# Patient Record
Sex: Female | Born: 1970 | Race: Black or African American | Hispanic: No | Smoking: Never smoker
Health system: Southern US, Community
[De-identification: ages and names within clinical notes are randomized; demographics above are authoritative.]

## PROBLEM LIST (undated history)

## (undated) DIAGNOSIS — K219 Gastro-esophageal reflux disease without esophagitis: Secondary | ICD-10-CM

## (undated) DIAGNOSIS — R011 Cardiac murmur, unspecified: Secondary | ICD-10-CM

## (undated) DIAGNOSIS — IMO0001 Reserved for inherently not codable concepts without codable children: Secondary | ICD-10-CM

## (undated) HISTORY — PX: MIDDLE EAR SURGERY: SHX713

## (undated) HISTORY — DX: Cardiac murmur, unspecified: R01.1

---

## 2007-03-20 ENCOUNTER — Emergency Department (HOSPITAL_COMMUNITY): Admission: EM | Admit: 2007-03-20 | Discharge: 2007-03-20 | Payer: Self-pay | Admitting: Emergency Medicine

## 2007-05-16 ENCOUNTER — Emergency Department (HOSPITAL_COMMUNITY): Admission: EM | Admit: 2007-05-16 | Discharge: 2007-05-16 | Payer: Self-pay | Admitting: Family Medicine

## 2007-05-29 ENCOUNTER — Emergency Department (HOSPITAL_COMMUNITY): Admission: EM | Admit: 2007-05-29 | Discharge: 2007-05-29 | Payer: Self-pay | Admitting: Emergency Medicine

## 2007-06-12 ENCOUNTER — Emergency Department (HOSPITAL_COMMUNITY): Admission: EM | Admit: 2007-06-12 | Discharge: 2007-06-12 | Payer: Self-pay | Admitting: Family Medicine

## 2007-06-27 ENCOUNTER — Emergency Department (HOSPITAL_COMMUNITY): Admission: EM | Admit: 2007-06-27 | Discharge: 2007-06-27 | Payer: Self-pay | Admitting: Emergency Medicine

## 2007-07-11 ENCOUNTER — Ambulatory Visit (HOSPITAL_BASED_OUTPATIENT_CLINIC_OR_DEPARTMENT_OTHER): Admission: RE | Admit: 2007-07-11 | Discharge: 2007-07-11 | Payer: Self-pay | Admitting: Otolaryngology

## 2007-10-02 ENCOUNTER — Emergency Department (HOSPITAL_COMMUNITY): Admission: EM | Admit: 2007-10-02 | Discharge: 2007-10-02 | Payer: Self-pay | Admitting: Family Medicine

## 2008-01-11 ENCOUNTER — Emergency Department (HOSPITAL_COMMUNITY): Admission: EM | Admit: 2008-01-11 | Discharge: 2008-01-11 | Payer: Self-pay | Admitting: Emergency Medicine

## 2008-01-25 ENCOUNTER — Ambulatory Visit: Payer: Self-pay | Admitting: Cardiology

## 2008-01-25 ENCOUNTER — Observation Stay (HOSPITAL_COMMUNITY): Admission: EM | Admit: 2008-01-25 | Discharge: 2008-01-25 | Payer: Self-pay | Admitting: Emergency Medicine

## 2008-04-02 ENCOUNTER — Inpatient Hospital Stay (HOSPITAL_COMMUNITY): Admission: RE | Admit: 2008-04-02 | Discharge: 2008-04-05 | Payer: Self-pay | Admitting: *Deleted

## 2008-04-02 ENCOUNTER — Encounter: Payer: Self-pay | Admitting: Emergency Medicine

## 2008-04-02 ENCOUNTER — Ambulatory Visit: Payer: Self-pay | Admitting: *Deleted

## 2008-07-02 ENCOUNTER — Emergency Department (HOSPITAL_COMMUNITY): Admission: EM | Admit: 2008-07-02 | Discharge: 2008-07-02 | Payer: Self-pay | Admitting: Emergency Medicine

## 2008-10-23 ENCOUNTER — Emergency Department (HOSPITAL_COMMUNITY): Admission: EM | Admit: 2008-10-23 | Discharge: 2008-10-24 | Payer: Self-pay | Admitting: Emergency Medicine

## 2008-11-24 ENCOUNTER — Emergency Department (HOSPITAL_COMMUNITY): Admission: EM | Admit: 2008-11-24 | Discharge: 2008-11-25 | Payer: Self-pay | Admitting: Emergency Medicine

## 2009-02-28 ENCOUNTER — Ambulatory Visit (HOSPITAL_BASED_OUTPATIENT_CLINIC_OR_DEPARTMENT_OTHER): Admission: RE | Admit: 2009-02-28 | Discharge: 2009-02-28 | Payer: Self-pay | Admitting: Otolaryngology

## 2009-12-30 ENCOUNTER — Emergency Department (HOSPITAL_COMMUNITY): Admission: EM | Admit: 2009-12-30 | Discharge: 2009-12-30 | Payer: Self-pay | Admitting: Emergency Medicine

## 2010-06-10 ENCOUNTER — Emergency Department (HOSPITAL_BASED_OUTPATIENT_CLINIC_OR_DEPARTMENT_OTHER)
Admission: EM | Admit: 2010-06-10 | Discharge: 2010-06-10 | Payer: Self-pay | Source: Home / Self Care | Admitting: Emergency Medicine

## 2010-06-15 LAB — URINALYSIS, ROUTINE W REFLEX MICROSCOPIC
Bilirubin Urine: NEGATIVE
Ketones, ur: NEGATIVE mg/dL
Leukocytes, UA: NEGATIVE
Protein, ur: NEGATIVE mg/dL
Urine Glucose, Fasting: NEGATIVE mg/dL
Urobilinogen, UA: 0.2 mg/dL (ref 0.0–1.0)
pH: 6 (ref 5.0–8.0)

## 2010-06-15 LAB — CBC
HCT: 38.5 % (ref 36.0–46.0)
Hemoglobin: 13 g/dL (ref 12.0–15.0)
MCH: 27.1 pg (ref 26.0–34.0)
RDW: 13.6 % (ref 11.5–15.5)

## 2010-06-15 LAB — POCT CARDIAC MARKERS
CKMB, poc: 4 ng/mL (ref 1.0–8.0)
Myoglobin, poc: 85.4 ng/mL (ref 12–200)

## 2010-06-15 LAB — URINE MICROSCOPIC-ADD ON

## 2010-06-15 LAB — COMPREHENSIVE METABOLIC PANEL
ALT: 20 U/L (ref 0–35)
AST: 39 U/L — ABNORMAL HIGH (ref 0–37)
Albumin: 4.5 g/dL (ref 3.5–5.2)
BUN: 15 mg/dL (ref 6–23)
Calcium: 9.7 mg/dL (ref 8.4–10.5)
Chloride: 107 mEq/L (ref 96–112)
Creatinine, Ser: 0.7 mg/dL (ref 0.4–1.2)
Sodium: 144 mEq/L (ref 135–145)
Total Protein: 8.1 g/dL (ref 6.0–8.3)

## 2010-06-15 LAB — DIFFERENTIAL
Eosinophils Relative: 1 % (ref 0–5)
Lymphocytes Relative: 33 % (ref 12–46)
Lymphs Abs: 2 10*3/uL (ref 0.7–4.0)

## 2010-06-15 LAB — D-DIMER, QUANTITATIVE: D-Dimer, Quant: 0.26 ug/mL-FEU (ref 0.00–0.48)

## 2010-08-24 ENCOUNTER — Other Ambulatory Visit: Payer: Self-pay | Admitting: Family Medicine

## 2010-08-24 ENCOUNTER — Ambulatory Visit
Admission: RE | Admit: 2010-08-24 | Discharge: 2010-08-24 | Disposition: A | Discharge status: Home / Self Care | Payer: PRIVATE HEALTH INSURANCE | Source: Ambulatory Visit | Attending: Family Medicine | Admitting: Family Medicine

## 2010-08-24 DIAGNOSIS — R0781 Pleurodynia: Secondary | ICD-10-CM

## 2010-08-31 LAB — COMPREHENSIVE METABOLIC PANEL
ALT: 41 U/L — ABNORMAL HIGH (ref 0–35)
AST: 46 U/L — ABNORMAL HIGH (ref 0–37)
CO2: 24 mEq/L (ref 19–32)
Chloride: 110 mEq/L (ref 96–112)
Creatinine, Ser: 0.88 mg/dL (ref 0.4–1.2)
GFR calc Af Amer: >60 mL/min (ref >60)
GFR calc non Af Amer: >60 mL/min (ref >60)
Glucose, Bld: 107 mg/dL — ABNORMAL HIGH (ref 70–99)
Total Bilirubin: 0.9 mg/dL (ref 0.3–1.2)

## 2010-08-31 LAB — CBC
HCT: 40.4 % (ref 36.0–46.0)
Hemoglobin: 13.4 g/dL (ref 12.0–15.0)
MCHC: 33.1 g/dL (ref 30.0–36.0)
MCV: 83.5 fL (ref 78.0–100.0)
Platelets: 204 10*3/uL (ref 150–400)
RBC: 4.83 MIL/uL (ref 3.87–5.11)
RDW: 13.9 % (ref 11.5–15.5)
WBC: 7.6 10*3/uL (ref 4.0–10.5)

## 2010-08-31 LAB — DIFFERENTIAL
Basophils Absolute: 0 10*3/uL (ref 0.0–0.1)
Basophils Relative: 0 % (ref 0–1)
Eosinophils Absolute: 0.3 10*3/uL (ref 0.0–0.7)
Eosinophils Relative: 5 % (ref 0–5)
Lymphocytes Relative: 31 % (ref 12–46)
Lymphs Abs: 2.3 10*3/uL (ref 0.7–4.0)
Monocytes Absolute: 0.5 10*3/uL (ref 0.1–1.0)
Monocytes Relative: 7 % (ref 3–12)
Neutro Abs: 4.4 10*3/uL (ref 1.7–7.7)
Neutrophils Relative %: 58 % (ref 43–77)

## 2010-08-31 LAB — URINE MICROSCOPIC-ADD ON

## 2010-08-31 LAB — PREGNANCY, URINE: Preg Test, Ur: NEGATIVE

## 2010-08-31 LAB — URINALYSIS, ROUTINE W REFLEX MICROSCOPIC
Bilirubin Urine: NEGATIVE
Hgb urine dipstick: NEGATIVE
Protein, ur: NEGATIVE mg/dL
Urobilinogen, UA: 0.2 mg/dL (ref 0.0–1.0)

## 2010-08-31 LAB — LIPASE, BLOOD: Lipase: 35 U/L (ref 11–59)

## 2010-09-08 LAB — DIFFERENTIAL
Basophils Absolute: 0.1 10*3/uL (ref 0.0–0.1)
Lymphocytes Relative: 28 % (ref 12–46)
Monocytes Absolute: 0.3 10*3/uL (ref 0.1–1.0)
Monocytes Relative: 5 % (ref 3–12)
Neutro Abs: 4.1 10*3/uL (ref 1.7–7.7)

## 2010-09-08 LAB — CBC
Hemoglobin: 14 g/dL (ref 12.0–15.0)
RBC: 4.96 MIL/uL (ref 3.87–5.11)
RDW: 13.7 % (ref 11.5–15.5)
WBC: 6.4 10*3/uL (ref 4.0–10.5)

## 2010-09-08 LAB — POCT I-STAT, CHEM 8
Calcium, Ion: 1.16 mmol/L (ref 1.12–1.32)
Glucose, Bld: 110 mg/dL — ABNORMAL HIGH (ref 70–99)
HCT: 44 % (ref 36.0–46.0)
Hemoglobin: 15 g/dL (ref 12.0–15.0)
Potassium: 3.9 mEq/L (ref 3.5–5.1)
TCO2: 24 mmol/L (ref 0–100)

## 2010-09-08 LAB — SAMPLE TO BLOOD BANK

## 2010-09-08 LAB — URINE MICROSCOPIC-ADD ON

## 2010-09-08 LAB — URINALYSIS, ROUTINE W REFLEX MICROSCOPIC
Bilirubin Urine: NEGATIVE
Nitrite: NEGATIVE
Specific Gravity, Urine: 1.014 (ref 1.005–1.030)
pH: 6.5 (ref 5.0–8.0)
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
## 2010-10-06 NOTE — H&P (Signed)
NAMEMALAN, WERK      ACCOUNT NO.:  [id number]   MEDICAL RECORD NO.:  [id number]          PATIENT TYPE:  INP   LOCATION:  3731                         FACILITY:  [facility]   PHYSICIAN:  Lonia Blood, M.D.       DATE OF BIRTH:  [birth year]   DATE OF ADMISSION:  01/24/2008  DATE OF DISCHARGE:  01/25/2008                              HISTORY & PHYSICAL   PRIMARY CARE PHYSICIAN:  This patient does not have a primary care  physician.   CHIEF COMPLAINT:  Chest pain.   HISTORY OF PRESENT ILLNESS:  [name] is a 40 year old refugee from  [place] of [place], [place] speaking, who comes into the  emergency room after about 3 months of episodes of on and off chest  pain.  The patient reports that the pain lasts for about 20-30 minutes.  She localizes the pain retrosternally radiating into the left upper  extremity.  She reports that the pain occasionally comes with work but  sometimes the pain also comes at rest.  She does not carry any  significant past medical history.  She does not smoke cigarettes.  She  does not know her family history.   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   HOME MEDICATIONS:  Various nonsteroidal anti-inflammatory drugs which  she has been taking after a fall that she sustained.   SOCIAL HISTORY:  The patient is a refugee, living in [place] with her  3 minor children, does not have much of a social network and support.   REVIEW OF SYSTEMS:  Negative for nausea, vomiting, or abdominal pain.  Negative diarrhea.  Negative headaches.  Otherwise as per HPI.   PHYSICAL EXAMINATION:  VITAL SIGNS:  Upon admission, temperature 97.1,  blood pressure 112/62, pulse 57, respirations 18, and saturation 100% on  room air.  GENERAL APPEARANCE:  She is a well-developed, well-nourished, African  American woman, in no acute distress, lying on a stretcher.  She is  alert and oriented to place, person, and time, calm and cooperative.  HEENT:  Head is normocephalic and atraumatic.  Eyes, pupils equal,  round, and reactive to light and accommodation.  Extraocular movements  are intact.  Throat clear.  NECK:  Supple.  No JVD.  CHEST:  Clear without wheeze, rhonchi, or crackles.  HEART:  Regular rate and rhythm without murmurs, rubs, or gallops.  ABDOMEN:  Soft, nontender, and nondistended.  Bowel sounds are present.  EXTREMITIES:  Lower extremities without edema.  SKIN:  Warm and dry without any suspicious-looking rashes.   LABORATORY VALUES:  At the time of admission, sodium 149, potassium 4.1,  white blood cell count 7.4, hemoglobin 13.1, and platelet count 209.  Troponin I is 0.01 and D-dimer 0.72.  Chest x-ray without any  infiltrates.  Chest CT negative for PE and infiltrates.  EKG shows some  negative T-waves in V1, hint of an ST elevation in I, II, III, F, and  VI.   ASSESSMENT AND PLAN:  Chest pain.  A 40 year old [place] American woman  without any significant cardiac history presenting today with what seems  to be possible angina.  She  has been worked up in the emergency room and  did not found to have a myocardial infarction and/or pulmonary emboli.  She is going to be placed on observation on the telemetry unit and we  are going to re-stratify her further with a Myoview stress test.  The  patient will receive symptomatic treatment for her pain with Tylenol and  ibuprofen.      Lonia Blood, M.D.  Electronically Signed     SL/MEDQ  D:  01/25/2008  T:  01/26/2008  Job:  [id number]

## 2010-10-06 NOTE — Discharge Summary (Signed)
NAMETATUMN, CORBRIDGE      ACCOUNT NO.:  [id number]   MEDICAL RECORD NO.:  [id number]          PATIENT TYPE:  INP   LOCATION:  3731                         FACILITY:  [facility]   PHYSICIAN:  Lonia Blood, M.D.       DATE OF BIRTH:  Mar 05, 1971   DATE OF ADMISSION:  01/24/2008  DATE OF DISCHARGE:  01/25/2008                               DISCHARGE SUMMARY   PRIMARY CARE PHYSICIAN:  This patient has been referred to [facility].   DISCHARGE DIAGNOSES:  1. Chest pain - felt to be musculoskeletal.  2. Mild obesity.   DISCHARGE MEDICATIONS:  1. Tylenol 500 mg every 6 hours as needed for pain.  2. Ibuprofen 200 mg 3 times a day.  3. Prilosec OTC 20 mg daily.   CONDITION ON DISCHARGE:  [name] was discharged in good condition  without any signs of distress.  She has got stable vital signs and she  is alert and oriented.  The patient was instructed to follow up with  [facility].   PROCEDURE:  The patient underwent a Myoview stress test which was  completely within normal limit.   CONSULTATION:  No consultations obtained.   History and physical, refer dictated H&P done by Dr. Harriette Ohara COURSE:  [name] is a 40 year old lady was placed in  observation from the emergency room.  She remained on telemetry without  any arrhythmias.  She did not have any recurrence of her chest pain.  The patient underwent a Myoview stress which is within normal limits and  since she had no further recurrence for symptoms she is discharged  currently in good condition and instructed to follow up with her new  primary care physician.      Lonia Blood, M.D.  Electronically Signed     SL/MEDQ  D:  01/25/2008  T:  01/26/2008  Job:  [id number]

## 2010-10-09 NOTE — Discharge Summary (Signed)
[name] Norris, [name] Norris      ACCOUNT NO.:  [id number]   MEDICAL RECORD NO.:  [id number]          PATIENT TYPE:  IPS   LOCATION:  0304                          [facility]  [facility]   PHYSICIAN:  Jasmine Pang, M.D. DATE OF BIRTH:  1971-05-20   DATE OF ADMISSION:  04/03/2008  DATE OF DISCHARGE:  04/05/2008                               DISCHARGE SUMMARY   IDENTIFICATION:  This is a 40 year old widowed African American female  who is a refugee from [place], who was admitted on voluntary admission on  April 03, 2008.   HISTORY OF PRESENT ILLNESS:  The patient states she has recently had a  history of depression with suicidal ideation.  She is a refugee from  [place].  Her husband was killed in the war there in 2007.  She does not  speak [place] and we had to use a translator to communicate.  She is  unemployed.  She is worried about having no job and may have no place to  live.  She had begun thoughts of suicide, but worried about who would  take care of her children.  She has been in the country less than 1  year.  She is sponsored by [employer], and they brought her to the  hospital after being concerned about her.   PAST PSYCHIATRIC HISTORY:  This is the first [facility] admission for the  patient.  She has had no previous psychiatric history.   FAMILY HISTORY:  None known.   ALCOHOL AND DRUG HISTORY:  She is a nonsmoker.  She does not use alcohol  or drugs.   MEDICAL PROBLEMS:  UTI, currently being treated with Macrobid.   MEDICATIONS:  Macrobid and rifampin and isoniazid (due to positive PPD  even though she does not have TB).   DRUG ALLERGIES:  No known drug allergies.   PHYSICAL FINDINGS:  The patient's physical exam was done in the ED prior  to admission here.  There were no acute physical or medical problems  noted.   ADMISSION LABORATORIES:  CBC was within normal limits.  UDS was  positive.  CMET was within normal limits.  Urine pregnancy test  negative.  Alcohol  level was less than 5.  Chest x-ray, no active  disease.  Urinalysis revealed a WBC of 3 to 6 and she is being currently  treated with Macrobid for what appears to be a UTI.   HOSPITAL COURSE:  Upon admission, the patient was started on Ambien 10  mg p.o. q.h.s. p.r.n.  She was also started on Macrobid 100 mg p.o.  b.i.d. for 7 days for her UTI and Celexa 10 mg p.o. daily, rifampin 600  mg p.o. daily, and isoniazid 300 mg p.o. daily.  The patient tolerated  these medications well with no side effects.  In individual sessions  with me, the patient was friendly and cooperative.  We had to speak  through a translator who spoke [place].  She did admit to some difficulty  falling asleep.  Her appetite was fair.  She stated she misses African  food.  Mood was becoming less depressed and less anxious.  She wanted to  go home to tomorrow.  She misses her children.  She has a 50 year old, 25-  year-old, and a 68-year-old.  She stated that she was less depressed.  She was not feeling like she wanted to harm herself.  She was willing to  go to [facility] for followup medication management.  She has a  support of a family through her church who was assisting her after her  discharge.  On April 05, 2008, sleep was good, appetite was good.  Mood was less depressed, less anxious.  Affect consistent with mood.  There was no suicidal or homicidal ideation.  No thoughts of self-  injurious behavior.  No auditory or visual hallucinations.  No paranoia  or delusions.  Thoughts were logical and goal-directed, thought content.  No predominant theme.  Cognitive was grossly intact.  Insight good,  judgment good, impulse control good.  It was felt the patient was ready  for discharge today.  She has a lot of community support.  The family  and her church who is helping her who is going to be taking her home.   DISCHARGE DIAGNOSES:  Axis I:  Major depressive disorder, single, severe  without psychosis.   Features of posttraumatic stress disorder (from the  war in [place]).  Axis II:  None.  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  Axis IV:  Severe (occupational problem, housing problem, other  psychosocial problem, burden of psychiatric illness)  Axis V:  Global assessment of functioning was 50 upon discharge.  GAF  was 35 upon admission.  GAF highest past year was 65-70.   DISCHARGE PLANS:  There was no specific activity level or dietary  restrictions.   POSTHOSPITAL CARE PLANS:  The patient will go to the [facility] on  April 05, 2008, at 2 o'clock p.m.   DISCHARGE MEDICATIONS:  1. Celexa 20 mg daily.  Resume INH and rifampin as her doctor      suggested.  Contact to doctor for other medications.  2. Macrobid 100 mg twice a day for five more days.  She is to see her      doctor if urinary tract infection is no better or if she develops      fever, chills, pain, etc.      Jasmine Pang, M.D.  Electronically Signed     BHS/MEDQ  D:  04/06/2008  T:  04/07/2008  Job:  [id number]

## 2010-12-10 ENCOUNTER — Emergency Department (HOSPITAL_COMMUNITY)
Admission: EM | Admit: 2010-12-10 | Discharge: 2010-12-10 | Disposition: A | Discharge status: Home / Self Care | Payer: PRIVATE HEALTH INSURANCE | Attending: Emergency Medicine | Admitting: Emergency Medicine

## 2010-12-10 ENCOUNTER — Emergency Department (HOSPITAL_COMMUNITY): Payer: PRIVATE HEALTH INSURANCE

## 2010-12-10 DIAGNOSIS — R079 Chest pain, unspecified: Secondary | ICD-10-CM | POA: Insufficient documentation

## 2010-12-10 DIAGNOSIS — K219 Gastro-esophageal reflux disease without esophagitis: Secondary | ICD-10-CM | POA: Insufficient documentation

## 2010-12-10 DIAGNOSIS — Z79899 Other long term (current) drug therapy: Secondary | ICD-10-CM | POA: Insufficient documentation

## 2010-12-10 DIAGNOSIS — R1013 Epigastric pain: Secondary | ICD-10-CM | POA: Insufficient documentation

## 2010-12-10 LAB — CK TOTAL AND CKMB (NOT AT ARMC)
CK, MB: 2.7 ng/mL (ref 0.3–4.0)
Relative Index: 1.3 (ref 0.0–2.5)
Total CK: 210 U/L — ABNORMAL HIGH (ref 7–177)

## 2010-12-10 LAB — DIFFERENTIAL
Basophils Absolute: 0 10*3/uL (ref 0.0–0.1)
Basophils Relative: 1 % (ref 0–1)
Eosinophils Absolute: 0.1 10*3/uL (ref 0.0–0.7)
Eosinophils Relative: 2 % (ref 0–5)
Lymphocytes Relative: 44 % (ref 12–46)

## 2010-12-10 LAB — BASIC METABOLIC PANEL
BUN: 10 mg/dL (ref 6–23)
Creatinine, Ser: 0.61 mg/dL (ref 0.50–1.10)
GFR calc Af Amer: >60 mL/min (ref >60)
GFR calc non Af Amer: >60 mL/min (ref >60)
Glucose, Bld: 117 mg/dL — ABNORMAL HIGH (ref 70–99)

## 2010-12-10 LAB — CBC
Platelets: 183 10*3/uL (ref 150–400)
RDW: 13.1 % (ref 11.5–15.5)
WBC: 6.5 10*3/uL (ref 4.0–10.5)

## 2010-12-10 LAB — LIPASE, BLOOD: Lipase: 51 U/L (ref 11–59)

## 2011-02-12 LAB — POCT HEMOGLOBIN-HEMACUE: Hemoglobin: 14.2

## 2011-02-23 LAB — COMPREHENSIVE METABOLIC PANEL
Albumin: 4.2
BUN: 17
Chloride: 105
Creatinine, Ser: 0.81
GFR calc non Af Amer: >60
Glucose, Bld: 98
Total Bilirubin: 0.7

## 2011-02-23 LAB — URINE MICROSCOPIC-ADD ON

## 2011-02-23 LAB — DIFFERENTIAL
Basophils Absolute: 0
Lymphocytes Relative: 33
Neutro Abs: 3.6
Neutrophils Relative %: 51

## 2011-02-23 LAB — URINE CULTURE: Colony Count: 40000

## 2011-02-23 LAB — URINALYSIS, ROUTINE W REFLEX MICROSCOPIC
Glucose, UA: NEGATIVE
Protein, ur: NEGATIVE
pH: 6

## 2011-02-23 LAB — CBC
HCT: 37.9
MCV: 82.7
Platelets: 216
WBC: 6.9

## 2011-02-24 LAB — DIFFERENTIAL
Basophils Absolute: 0
Basophils Relative: 0
Eosinophils Absolute: 0.4
Neutro Abs: 4.4
Neutrophils Relative %: 60

## 2011-02-24 LAB — POCT I-STAT, CHEM 8
Glucose, Bld: 94
HCT: 40
Hemoglobin: 13.6
Potassium: 4.1
Sodium: 141
TCO2: 25

## 2011-02-24 LAB — LIPID PANEL
Cholesterol: 158
HDL: 43

## 2011-02-24 LAB — POCT CARDIAC MARKERS
CKMB, poc: <1 — ABNORMAL LOW
CKMB, poc: <1 — ABNORMAL LOW
Troponin i, poc: 0.47 — ABNORMAL HIGH
Troponin i, poc: <0.05

## 2011-02-24 LAB — CK TOTAL AND CKMB (NOT AT ARMC)
CK, MB: 0.6
Relative Index: 1
Relative Index: INVALID

## 2011-02-24 LAB — CBC
MCHC: 33.9
MCV: 82.6
RDW: 13.6

## 2011-02-24 LAB — TROPONIN I
Troponin I: 0.01
Troponin I: <0.01

## 2011-02-26 LAB — POCT PREGNANCY, URINE
Operator id: 235561
Preg Test, Ur: NEGATIVE

## 2011-04-11 ENCOUNTER — Emergency Department (INDEPENDENT_AMBULATORY_CARE_PROVIDER_SITE_OTHER)
Admission: EM | Admit: 2011-04-11 | Discharge: 2011-04-11 | Disposition: A | Discharge status: Home / Self Care | Payer: PRIVATE HEALTH INSURANCE | Source: Home / Self Care | Attending: Emergency Medicine | Admitting: Emergency Medicine

## 2011-04-11 ENCOUNTER — Encounter: Payer: Self-pay | Admitting: *Deleted

## 2011-04-11 ENCOUNTER — Other Ambulatory Visit: Payer: Self-pay

## 2011-04-11 ENCOUNTER — Emergency Department (INDEPENDENT_AMBULATORY_CARE_PROVIDER_SITE_OTHER): Payer: PRIVATE HEALTH INSURANCE

## 2011-04-11 DIAGNOSIS — K219 Gastro-esophageal reflux disease without esophagitis: Secondary | ICD-10-CM

## 2011-04-11 HISTORY — DX: Gastro-esophageal reflux disease without esophagitis: K21.9

## 2011-04-11 MED ORDER — TRAMADOL HCL 50 MG PO TABS: 50.0000 mg | ORAL_TABLET | Freq: Four times a day (QID) | ORAL | Status: DC | PRN

## 2011-04-11 MED ORDER — SUCRALFATE 1 GM/10ML PO SUSP: 1.0000 g | Freq: Four times a day (QID) | ORAL | Status: DC

## 2011-04-11 MED ORDER — ESOMEPRAZOLE MAGNESIUM 40 MG PO CPDR: 40.0000 mg | DELAYED_RELEASE_CAPSULE | Freq: Two times a day (BID) | ORAL | Status: DC

## 2011-04-11 MED ORDER — GI COCKTAIL ~~LOC~~
30.0000 mL | Freq: Once | ORAL | Status: AC
  Administered 2011-04-11: 30 mL via ORAL

## 2011-04-11 MED ORDER — GI COCKTAIL ~~LOC~~
ORAL | Status: AC
  Filled 2011-04-11: qty 30

## 2011-04-11 NOTE — ED Provider Notes (Signed)
History     CSN: [id number] Arrival date & time: 04/11/2011  3:16 PM   First MD Initiated Contact with Patient 04/11/11 1439      Chief Complaint  Patient presents with  . Chest Pain    (Consider location/radiation/quality/duration/timing/severity/associated sxs/prior treatment) HPI Comments: [name] Norris has had a one-week history of continuous pain in her left submammary area radiating around to the back. The pain is constant and severe. She feels mildly short of breath off and on and has had a few episodes of diaphoresis. The pain is worse at work, when she lies down, or after certain foods. It's also worse with a deep breath. There is nothing particular that relieves it. It feels like a burning or a sharp feeling. She's felt nauseated and vomited once and she has had some dysphagia and a dry cough. She saw her primary care physician for this last week and was diagnosed with reflux and given omeprazole. She states this has not helped at all. She has had similar pain going on for at least 4 months. She was in the emergency room with this pain back in July and had a negative cardiac workup at that time.  Patient is a 40 y.o. female presenting with chest pain.  Chest Pain Primary symptoms include shortness of breath, nausea and vomiting. Pertinent negatives for primary symptoms include no fever, no cough, no wheezing, no palpitations and no abdominal pain.  Associated symptoms include diaphoresis.     Past Medical History  Diagnosis Date  . Acid reflux     Past Surgical History  Procedure Date  . Cesarean section     History reviewed. No pertinent family history.  History  Substance Use Topics  . Smoking status: Not on file  . Smokeless tobacco: Not on file  . Alcohol Use: Yes    OB History    Grav Para Term Preterm Abortions TAB SAB Ect Mult Living                  Review of Systems  Constitutional: Positive for diaphoresis. Negative for fever and chills.  Respiratory:  Positive for shortness of breath. Negative for cough and wheezing.   Cardiovascular: Positive for chest pain. Negative for palpitations and leg swelling.  Gastrointestinal: Positive for nausea and vomiting. Negative for abdominal pain.    Allergies  Review of patient's allergies indicates no known allergies.  Home Medications   Current Outpatient Rx  Name Route Sig Dispense Refill  . DEXTROMETHORPHAN-GUAIFENESIN 30-600 MG PO TB12 Oral Take 1 tablet by mouth every 12 (twelve) hours.      Marland Kitchen ESOMEPRAZOLE MAGNESIUM 40 MG PO CPDR Oral Take 1 capsule (40 mg total) by mouth 2 (two) times daily at 10 AM and 5 PM. 30 capsule 0  . SUCRALFATE 1 GM/10ML PO SUSP Oral Take 10 mLs (1 g total) by mouth 4 (four) times daily. 420 mL 0  . TRAMADOL HCL 50 MG PO TABS Oral Take 1 tablet (50 mg total) by mouth every 6 (six) hours as needed for pain. Maximum dose= 8 tablets per day 30 tablet 0    BP 136/86  Pulse 69  Temp(Src) 98.5 F (36.9 C) (Oral)  Resp 19  SpO2 98%  Physical Exam  Nursing note and vitals reviewed. Constitutional: She appears well-developed and well-nourished. She appears distressed.       She appears uncomfortable.  Cardiovascular: Normal rate, regular rhythm, S1 normal, S2 normal, intact distal pulses and normal pulses.   No extrasystoles  are present. PMI is not displaced.  Exam reveals no gallop, no S3, no S4, no distant heart sounds and no friction rub.   No murmur heard. Pulmonary/Chest: Effort normal and breath sounds normal. No respiratory distress. She has no wheezes. She has no rales. She exhibits no tenderness.  Abdominal: Soft. Bowel sounds are normal. She exhibits no distension and no mass. There is no tenderness. There is no rebound and no guarding.  Skin: Skin is warm and dry. No rash noted. She is not diaphoretic.    ED Course  Procedures (including critical care)   Date: 04/11/2011  Rate: 62  Rhythm: normal sinus rhythm  QRS Axis: normal  Intervals: normal   ST/T Wave abnormalities: normal  Conduction Disutrbances:none  Narrative Interpretation: Normal EKG.  Old EKG Reviewed: none available   Results for orders placed during the hospital encounter of 04/11/11  POCT H PYLORI SCREEN      Component Value Range   H. PYLORI SCREEN, POC NEGATIVE  NEGATIVE   D-DIMER, QUANTITATIVE      Component Value Range   D-Dimer, Quant 0.51 (*) 0.00 - 0.48 (ug/mL-FEU)     Labs Reviewed  D-DIMER, QUANTITATIVE - Abnormal; Notable for the following:    D-Dimer, Quant 0.51 (*)    All other components within normal limits  POCT H PYLORI SCREEN   Dg Chest 2 View  04/11/2011  *RADIOLOGY REPORT*  Clinical Data: Chest pain  CHEST - 2 VIEW  Comparison: 12/10/2010  Findings: Cardiomediastinal silhouette is within normal limits. The lungs are clear. No pleural effusion.  No pneumothorax.  No acute osseous abnormality.  IMPRESSION: Normal chest.  Original Report Authenticated By: Harrel Lemon, M.D.    The patient was given the following meds in the [facility]:   Medications  omeprazole (PRILOSEC) 20 MG capsule (not administered)  dextromethorphan-guaiFENesin (MUCINEX DM) 30-600 MG per 12 hr tablet (not administered)  naproxen sodium (ANAPROX) 220 MG tablet (not administered)  esomeprazole (NEXIUM) 40 MG capsule (not administered)  sucralfate (CARAFATE) 1 GM/10ML suspension (not administered)  traMADol (ULTRAM) 50 MG tablet (not administered)  gi cocktail (30 mL Oral Given 04/11/11 1604)     And had the following response:  Her pain improved after one dose of GI cocktail.   1. GERD (gastroesophageal reflux disease)       MDM  I think she has reflux esophagitis. Her d-dimer was only minimally elevated and. She did improve extra dose of GI cocktail. I have sent her home on Nexium and Carafate and told her to leave off the knee that she was taking. She was given tramadol for the pain and is to followup with [facility] as soon as possible.        Roque Lias, MD 04/11/11 [phone]

## 2011-04-11 NOTE — ED Notes (Signed)
Co chest pain underneath left breast, some underneath right breast, nonradiating, states she does have some diaphoresis and sob with episodes of pain.

## 2011-04-12 ENCOUNTER — Encounter: Payer: Self-pay | Admitting: Gastroenterology

## 2011-04-13 ENCOUNTER — Emergency Department (HOSPITAL_COMMUNITY): Payer: PRIVATE HEALTH INSURANCE

## 2011-04-13 ENCOUNTER — Emergency Department (HOSPITAL_COMMUNITY)
Admission: EM | Admit: 2011-04-13 | Discharge: 2011-04-13 | Disposition: A | Discharge status: Home / Self Care | Payer: PRIVATE HEALTH INSURANCE | Attending: Emergency Medicine | Admitting: Emergency Medicine

## 2011-04-13 ENCOUNTER — Encounter (HOSPITAL_COMMUNITY): Payer: Self-pay | Admitting: Emergency Medicine

## 2011-04-13 ENCOUNTER — Other Ambulatory Visit: Payer: Self-pay

## 2011-04-13 DIAGNOSIS — R059 Cough, unspecified: Secondary | ICD-10-CM | POA: Insufficient documentation

## 2011-04-13 DIAGNOSIS — K219 Gastro-esophageal reflux disease without esophagitis: Secondary | ICD-10-CM | POA: Insufficient documentation

## 2011-04-13 DIAGNOSIS — R079 Chest pain, unspecified: Secondary | ICD-10-CM | POA: Insufficient documentation

## 2011-04-13 DIAGNOSIS — R05 Cough: Secondary | ICD-10-CM | POA: Insufficient documentation

## 2011-04-13 LAB — POCT I-STAT TROPONIN I: Troponin i, poc: 0 ng/mL (ref 0.00–0.08)

## 2011-04-13 MED ORDER — FENTANYL CITRATE 0.05 MG/ML IJ SOLN
INTRAMUSCULAR | Status: AC
  Filled 2011-04-13: qty 2

## 2011-04-13 MED ORDER — IOHEXOL 300 MG/ML  SOLN
100.0000 mL | Freq: Once | INTRAMUSCULAR | Status: AC | PRN
  Administered 2011-04-13: 100 mL via INTRAVENOUS

## 2011-04-13 MED ORDER — ONDANSETRON HCL 4 MG/2ML IJ SOLN
INTRAMUSCULAR | Status: AC
  Filled 2011-04-13: qty 2

## 2011-04-13 MED ORDER — GI COCKTAIL ~~LOC~~
30.0000 mL | Freq: Once | ORAL | Status: AC
  Administered 2011-04-13: 30 mL via ORAL
  Filled 2011-04-13: qty 30

## 2011-04-13 NOTE — ED Notes (Signed)
Pt given discharge instructions via language line. Pt unable to esign d/c instruction be/c e sign not working at time of d/c

## 2011-04-13 NOTE — ED Provider Notes (Signed)
History     CSN: [id number] Arrival date & time: 04/13/2011  2:04 PM   First MD Initiated Contact with Patient 04/13/11 1407      Chief Complaint  Patient presents with  . Chest Pain    (Consider location/radiation/quality/duration/timing/severity/associated sxs/prior treatment) Patient is a 40 y.o. female presenting with chest pain. The history is provided by the patient. The history is limited by a language barrier. A language interpreter was used.  Chest Pain The chest pain began 1 - 2 weeks ago. Chest pain occurs intermittently. The chest pain is unchanged. The pain is associated with breathing. At its most intense, the pain is at 8/10. The pain is currently at 8/10. The quality of the pain is described as sharp. Primary symptoms include cough. Pertinent negatives for primary symptoms include no fever, no shortness of breath, no wheezing, no palpitations, no abdominal pain, no nausea, no vomiting and no dizziness. Primary symptoms comment: cough for 3 days She tried nitroglycerin, antacids and aspirin (not much improvement) for the symptoms.   Pt speaks only french and using the interpretor phones she was unable to answer most of my questions however stated that breathing makes it worse and it's worse today than it has been. She has been dealing with this for 2 weeks off and on. Nitro given by EMS did not alleviate the pain.   Past Medical History  Diagnosis Date  . Acid reflux     Past Surgical History  Procedure Date  . Cesarean section     History reviewed. No pertinent family history.  History  Substance Use Topics  . Smoking status: Not on file  . Smokeless tobacco: Not on file  . Alcohol Use: Yes    OB History    Grav Para Term Preterm Abortions TAB SAB Ect Mult Living                  Review of Systems  Constitutional: Negative for fever, chills, activity change and appetite change.  Respiratory: Positive for cough. Negative for apnea, choking, chest  tightness, shortness of breath and wheezing.   Cardiovascular: Positive for chest pain. Negative for palpitations and leg swelling.  Gastrointestinal: Negative for nausea, vomiting, abdominal pain, diarrhea, constipation, blood in stool and anal bleeding.  Neurological: Negative for dizziness.  All other systems reviewed and are negative.    Allergies  Review of patient's allergies indicates no known allergies.  Home Medications   Current Outpatient Rx  Name Route Sig Dispense Refill  . DEXTROMETHORPHAN-GUAIFENESIN 30-600 MG PO TB12 Oral Take 1 tablet by mouth every 12 (twelve) hours.      Marland Kitchen ESOMEPRAZOLE MAGNESIUM 40 MG PO CPDR Oral Take 1 capsule (40 mg total) by mouth 2 (two) times daily at 10 AM and 5 PM. 30 capsule 0  . SUCRALFATE 1 GM/10ML PO SUSP Oral Take 10 mLs (1 g total) by mouth 4 (four) times daily. 420 mL 0  . TRAMADOL HCL 50 MG PO TABS Oral Take 1 tablet (50 mg total) by mouth every 6 (six) hours as needed for pain. Maximum dose= 8 tablets per day 30 tablet 0    BP 137/68  Pulse 87  Resp 24  SpO2 97%  Physical Exam  Constitutional: She is oriented to person, place, and time. She appears well-developed and well-nourished.  HENT:  Head: Normocephalic and atraumatic.  Eyes: EOM are normal. Pupils are equal, round, and reactive to light.  Neck: Normal range of motion. Neck supple.  Cardiovascular: Normal  rate and regular rhythm.   Pulmonary/Chest:       Pt uncooperative and crying during exam. Would not take deep breath, no obvious rales or crackles heard on exam.  Abdominal: Soft. She exhibits no distension. There is no tenderness. There is no rebound.       Voluntary guarding from pain crying and moaning. Bowel sounds heard.  Neurological: She is alert and oriented to person, place, and time. No cranial nerve deficit.  Skin: Skin is warm and dry.    ED Course  Procedures (including critical care time)   Labs Reviewed  I-STAT TROPONIN I   Dg Chest 2  View  04/11/2011  *RADIOLOGY REPORT*  Clinical Data: Chest pain  CHEST - 2 VIEW  Comparison: 12/10/2010  Findings: Cardiomediastinal silhouette is within normal limits. The lungs are clear. No pleural effusion.  No pneumothorax.  No acute osseous abnormality.  IMPRESSION: Normal chest.  Original Report Authenticated By: Harrel Lemon, M.D.     1. Acid reflux       MDM  Since pt had elevated d-dimer two days ago and comes in with repeat chest pain and lack of good historian to describe pain will get ct angio to rule out PE, check troponin to rule out cardiac etiology. Not likely to be cardiac in nature as pt had negative work up in July of this year. Last work up for this pain 2 days ago. She states that nothing has helped but cannot provide much history. Nitroglycerin did not help the pain from EMS and pt was given aspirin by EMS. Will try GI cocktail to see if that alleviates the pain. Unclear what medications pt has actually been taking.      3:22 PM Pt seen in the room and resting comfortably. If all rule outs are negative can discharge with GI recommendation for follow up as GI cocktail seems to help the pain. Will sign out to doctor on call. Troponin and CTangio chest still pending. Discussed with Dr. Radford Pax.  Genella Mech, MD 04/13/11 [phone]

## 2011-04-13 NOTE — ED Notes (Signed)
Per EMS, patient was picked up at [facility].  Patient was found moaning and crying in car; passerby called EMS.  Patient speaks little [place] (main language [place]); was just seen in hospital and was released home this past Sunday.  Upon EMS arrival, patient was found crying, restless, and moaning in her vehicle.  Patient hot and sweaty.  Points across chest (underneath breast) and stated, "This hurts".  Pharmacist at [facility] came out and gave patient 324 aspirin; 1 nitro given by EMS (patient states that it did not help with pain).  12 lead done; unremarkable.  No IV started.  CBG -- 97.  Vitals -- 120/90 BP; 80 HR.

## 2011-04-13 NOTE — ED Provider Notes (Signed)
History/physical exam/procedure(s) were performed by non-physician practitioner and as supervising physician I was immediately available for consultation/collaboration. I have reviewed all notes and am in agreement with care and plan.   Hilario Quarry, MD 04/13/11 1540

## 2011-04-13 NOTE — ED Notes (Signed)
Patient brought in by EMS; patient speaks [place] -- using interpreter phones at bedside to translate.  Patient complaining of chest pain; has been in and out of the hospital for the past two weeks for the same symptoms.  Describes location of chest pain as "left" and "right" sided; pain worsens upon breathing.  Pain started today while she was driving.  Today, patient was found crying and moaning in her car by a passerby -- EMS was called to assist patient.  Patient was given 324 mg aspirin by pharmacist at [facility] and 1 nitro SL tablet by EMS.  Patient denies having recent fevers.  Denies cardiac history.  Upon arrival to room, patient connected to continuous cardiac, pulse ox, and blood pressure monitor.  ECG done.  Patient tearful, restless, and moaning.  EDP at bedside.  Will continue to monitor.

## 2011-04-16 ENCOUNTER — Emergency Department (HOSPITAL_COMMUNITY): Payer: PRIVATE HEALTH INSURANCE

## 2011-04-16 ENCOUNTER — Encounter (HOSPITAL_COMMUNITY): Payer: Self-pay | Admitting: Emergency Medicine

## 2011-04-16 ENCOUNTER — Emergency Department (HOSPITAL_COMMUNITY)
Admission: EM | Admit: 2011-04-16 | Discharge: 2011-04-17 | Disposition: A | Discharge status: Home / Self Care | Payer: PRIVATE HEALTH INSURANCE | Attending: Emergency Medicine | Admitting: Emergency Medicine

## 2011-04-16 DIAGNOSIS — Z79899 Other long term (current) drug therapy: Secondary | ICD-10-CM | POA: Insufficient documentation

## 2011-04-16 DIAGNOSIS — K219 Gastro-esophageal reflux disease without esophagitis: Secondary | ICD-10-CM | POA: Insufficient documentation

## 2011-04-16 LAB — CBC
Hemoglobin: 12.8 g/dL (ref 12.0–15.0)
MCH: 27.4 pg (ref 26.0–34.0)
Platelets: 194 10*3/uL (ref 150–400)
RBC: 4.68 MIL/uL (ref 3.87–5.11)

## 2011-04-16 LAB — POCT I-STAT TROPONIN I: Troponin i, poc: 0 ng/mL (ref 0.00–0.08)

## 2011-04-16 NOTE — ED Notes (Signed)
PT. REPORTS INTERMITTENT SUBSTERNAL CHEST PAIN X 3 WEEKS WORSE WITH DEEP INSPIRATION AND OCCASIONAL DRY COUGH , SLIGHT NAUSEA, NO DIAPHORESIS.

## 2011-04-17 LAB — BASIC METABOLIC PANEL
CO2: 26 mEq/L (ref 19–32)
Calcium: 10 mg/dL (ref 8.4–10.5)
GFR calc non Af Amer: >90 mL/min (ref >90)
Potassium: 3.8 mEq/L (ref 3.5–5.1)
Sodium: 136 mEq/L (ref 135–145)

## 2011-04-17 MED ORDER — SODIUM CHLORIDE 0.9 % IV BOLUS (SEPSIS)
1000.0000 mL | Freq: Once | INTRAVENOUS | Status: AC
  Administered 2011-04-17: 1000 mL via INTRAVENOUS

## 2011-04-17 MED ORDER — FAMOTIDINE IN NACL 20-0.9 MG/50ML-% IV SOLN
20.0000 mg | Freq: Once | INTRAVENOUS | Status: AC
  Administered 2011-04-17: 20 mg via INTRAVENOUS
  Filled 2011-04-17: qty 50

## 2011-04-17 MED ORDER — GI COCKTAIL ~~LOC~~
30.0000 mL | Freq: Once | ORAL | Status: AC
  Administered 2011-04-17: 30 mL via ORAL
  Filled 2011-04-17: qty 30

## 2011-04-17 MED ORDER — FAMOTIDINE 20 MG PO TABS: 20.0000 mg | ORAL_TABLET | Freq: Two times a day (BID) | ORAL | Status: DC

## 2011-04-17 MED ORDER — PANTOPRAZOLE SODIUM 40 MG IV SOLR
40.0000 mg | Freq: Once | INTRAVENOUS | Status: AC
  Administered 2011-04-17: 40 mg via INTRAVENOUS
  Filled 2011-04-17: qty 40

## 2011-04-17 MED ORDER — ONDANSETRON HCL 4 MG/2ML IJ SOLN
4.0000 mg | Freq: Once | INTRAMUSCULAR | Status: AC
  Administered 2011-04-17: 4 mg via INTRAVENOUS
  Filled 2011-04-17: qty 2

## 2011-04-17 MED ORDER — HYDROMORPHONE HCL PF 1 MG/ML IJ SOLN
1.0000 mg | Freq: Once | INTRAMUSCULAR | Status: AC
  Administered 2011-04-17: 1 mg via INTRAVENOUS
  Filled 2011-04-17: qty 1

## 2011-04-17 NOTE — ED Provider Notes (Signed)
History     CSN: [id number] Arrival date & time: 04/16/2011 10:34 PM   First MD Initiated Contact with Patient 04/17/11 0133      Chief Complaint  Patient presents with  . Chest Pain    (Consider location/radiation/quality/duration/timing/severity/associated sxs/prior treatment) Patient is a 40 y.o. female presenting with chest pain. The history is provided by the patient.  Chest Pain The chest pain began 2 days ago. Duration of episode(s) is 48 hours. Chest pain occurs constantly. The chest pain is unchanged. Associated with: Food makes it worse. The severity of the pain is severe. The quality of the pain is described as burning. The pain radiates to the epigastrium. Chest pain is worsened by eating. Primary symptoms include nausea and vomiting. Pertinent negatives for primary symptoms include no fever, no syncope, no cough, no wheezing, no palpitations and no altered mental status.  Pertinent negatives for associated symptoms include no diaphoresis. She tried proton pump inhibitors for the symptoms. Risk factors include no known risk factors.  Pertinent negatives for past medical history include no CAD.    patient's multiple ED visits for reflux symptoms is on Carafate and proton pump inhibitor with persistent ongoing symptoms. She is scheduled to see gastroenterology on December 4. She denies any blood in emesis or black or tarry stools. She has a history of peptic ulcer disease. Tonight she presents because she is unable to sleep your symptoms. She states that anything she eats makes it worse. No fevers. No right upper quadrant abdominal pain or history of gallbladder disease.  Past Medical History  Diagnosis Date  . Acid reflux     Past Surgical History  Procedure Date  . Cesarean section     No family history on file.  History  Substance Use Topics  . Smoking status: Not on file  . Smokeless tobacco: Not on file  . Alcohol Use: Yes    OB History    Grav Para Term  Preterm Abortions TAB SAB Ect Mult Living                  Review of Systems  Constitutional: Negative for fever and diaphoresis.  Respiratory: Negative for cough, wheezing and stridor.   Cardiovascular: Positive for chest pain. Negative for palpitations and syncope.  Gastrointestinal: Positive for nausea and vomiting. Negative for diarrhea, constipation and blood in stool.  Genitourinary: Negative for hematuria and pelvic pain.  Musculoskeletal: Negative for back pain.  Skin: Negative for color change and wound.  Neurological: Negative for syncope.  Psychiatric/Behavioral: Negative for altered mental status.    Allergies  Review of patient's allergies indicates no known allergies.  Home Medications   Current Outpatient Rx  Name Route Sig Dispense Refill  . ESOMEPRAZOLE MAGNESIUM 40 MG PO CPDR Oral Take 1 capsule (40 mg total) by mouth 2 (two) times daily at 10 AM and 5 PM. 30 capsule 0  . SUCRALFATE 1 GM/10ML PO SUSP Oral Take 10 mLs (1 g total) by mouth 4 (four) times daily. 420 mL 0  . TRAMADOL HCL 50 MG PO TABS Oral Take 50 mg by mouth every 6 (six) hours as needed. For pain       BP 117/71  Pulse 56  Temp(Src) 97.7 F (36.5 C) (Oral)  Resp 14  SpO2 97%  LMP 03/13/2011  Physical Exam  Constitutional: She is oriented to person, place, and time. She appears well-developed and well-nourished.  HENT:  Head: Normocephalic and atraumatic.  Eyes: Conjunctivae and EOM are normal.  Pupils are equal, round, and reactive to light.  Neck: Trachea normal. Neck supple. No thyromegaly present.  Cardiovascular: Normal rate, regular rhythm, S1 normal, S2 normal and normal pulses.     No systolic murmur is present   No diastolic murmur is present  Pulses:      Radial pulses are 2+ on the right side, and 2+ on the left side.  Pulmonary/Chest: Effort normal and breath sounds normal. She has no wheezes. She has no rhonchi. She has no rales. She exhibits no tenderness.  Abdominal:  Soft. Normal appearance and bowel sounds are normal. There is no CVA tenderness and negative Murphy's sign.       Mild epigastric tenderness. Negative Murphy's sign and no right upper quadrant tenderness. No left upper quadrant tenderness  Musculoskeletal:       BLE:s Calves nontender, no cords or erythema, negative Homans sign  Neurological: She is alert and oriented to person, place, and time. She has normal strength. No cranial nerve deficit or sensory deficit. GCS eye subscore is 4. GCS verbal subscore is 5. GCS motor subscore is 6.  Skin: Skin is warm and dry. No rash noted. She is not diaphoretic.  Psychiatric: Her speech is normal.       Cooperative and appropriate    ED Course  Procedures (including critical care time)  Results for orders placed during the hospital encounter of 04/16/11  CBC      Component Value Range   WBC 5.2  4.0 - 10.5 (K/uL)   RBC 4.68  3.87 - 5.11 (MIL/uL)   Hemoglobin 12.8  12.0 - 15.0 (g/dL)   HCT 16.1  09.6 - 04.5 (%)   MCV 82.5  78.0 - 100.0 (fL)   MCH 27.4  26.0 - 34.0 (pg)   MCHC 33.2  30.0 - 36.0 (g/dL)   RDW 40.9  81.1 - 91.4 (%)   Platelets 194  150 - 400 (K/uL)  BASIC METABOLIC PANEL      Component Value Range   Sodium 136  135 - 145 (mEq/L)   Potassium 3.8  3.5 - 5.1 (mEq/L)   Chloride 100  96 - 112 (mEq/L)   CO2 26  19 - 32 (mEq/L)   Glucose, Bld 96  70 - 99 (mg/dL)   BUN 11  6 - 23 (mg/dL)   Creatinine, Ser 7.82  0.50 - 1.10 (mg/dL)   Calcium 95.6  8.4 - 10.5 (mg/dL)   GFR calc non Af Amer >90  >90 (mL/min)   GFR calc Af Amer >90  >90 (mL/min)  POCT I-STAT TROPONIN I      Component Value Range   Troponin i, poc 0.00  0.00 - 0.08 (ng/mL)   Comment 3            Dg Chest 2 View  04/16/2011  *RADIOLOGY REPORT*  Clinical Data: Chest pain, burning sensation, nausea, vomiting, history acid reflux  CHEST - 2 VIEW  Comparison: 04/11/2011  Findings: Borderline enlargement of cardiac silhouette. Mediastinal contours and pulmonary  vascularity normal. Lungs clear. No pleural effusion or pneumothorax. Bones unremarkable.  IMPRESSION: Borderline enlargement of cardiac silhouette. No acute abnormalities.  Original Report Authenticated By: Lollie Marrow, M.D.   Dg Chest 2 View  04/11/2011  *RADIOLOGY REPORT*  Clinical Data: Chest pain  CHEST - 2 VIEW  Comparison: 12/10/2010  Findings: Cardiomediastinal silhouette is within normal limits. The lungs are clear. No pleural effusion.  No pneumothorax.  No acute osseous abnormality.  IMPRESSION: Normal chest.  Original Report Authenticated By: Harrel Lemon, M.D.   Ct Angio Chest W/cm &/or Wo Cm  04/13/2011  *RADIOLOGY REPORT*  Clinical Data:  Chest pain, elevated D-dimer, evaluate for PE  CT ANGIOGRAPHY CHEST WITH CONTRAST  Technique:  Multidetector CT imaging of the chest was performed using the standard protocol during bolus administration of intravenous contrast.  Multiplanar CT image reconstructions including MIPs were obtained to evaluate the vascular anatomy.  Contrast: OMNIPAQUE IOHEXOL 300 MG/ML IV SOLN  Comparison:  Chest radiographs dated 04/11/2011.  CT chest dated 07/02/2008.  Findings:  No evidence of pulmonary embolism.  Lungs are essentially clear.  Mild atelectasis in the left lower lobe.  No suspicious pulmonary nodules.  No pleural effusion or pneumothorax.  Visualized thyroid is unremarkable.  The heart is normal in size.  No pericardial effusion.  No suspicious mediastinal, hilar, or axillary lymphadenopathy.  Visualized upper abdomen is unremarkable.  Visualized osseous structures are within normal limits.  Review of the MIP images confirms the above findings.  IMPRESSION: No evidence of pulmonary embolism.  No evidence of acute cardiopulmonary disease.  Original Report Authenticated By: Charline Bills, M.D.    Date: 04/17/2011  Rate: 70  Rhythm: normal sinus rhythm  QRS Axis: normal  Intervals: normal  ST/T Wave abnormalities: nonspecific ST changes   Conduction Disutrbances:none  Narrative Interpretation:   Old EKG Reviewed: unchanged    Patient with multiple ED visits for chest pain, being treated for reflux and has GI followup scheduled for December 4. Chest x-ray, EKG and labs obtained and reviewed as above. Patient given IV Pepcid and Protonix and Dilaudid with improvement of her symptoms. Given multiple evaluations for the same, I offered admission patient declined. She does request a work note which was provided.     MDM   Chest pain and epigastric burning likely GI etiology. Doubt ACS. Screening EKG and labs as above. Chest x-ray reviewed. No symptoms of bleeding ulcer.          Sunnie Nielsen, MD 04/17/11 [phone]

## 2011-04-17 NOTE — ED Notes (Signed)
PT reports epigastric pain and nausea. Area is nontender. Vomiting x 2 reported. Pt is A&Ox3; no acute signs of distress. Some guarding of area present.

## 2011-04-27 ENCOUNTER — Ambulatory Visit: Payer: PRIVATE HEALTH INSURANCE | Admitting: Gastroenterology

## 2011-05-13 ENCOUNTER — Other Ambulatory Visit: Payer: Self-pay | Admitting: Gastroenterology

## 2011-05-13 ENCOUNTER — Other Ambulatory Visit (INDEPENDENT_AMBULATORY_CARE_PROVIDER_SITE_OTHER): Payer: PRIVATE HEALTH INSURANCE

## 2011-05-13 ENCOUNTER — Encounter: Payer: Self-pay | Admitting: Gastroenterology

## 2011-05-13 ENCOUNTER — Ambulatory Visit (INDEPENDENT_AMBULATORY_CARE_PROVIDER_SITE_OTHER): Payer: PRIVATE HEALTH INSURANCE | Admitting: Gastroenterology

## 2011-05-13 VITALS — BP 102/66 | HR 60 | Ht 65.0 in | Wt 177.8 lb

## 2011-05-13 DIAGNOSIS — R0789 Other chest pain: Secondary | ICD-10-CM

## 2011-05-13 DIAGNOSIS — K219 Gastro-esophageal reflux disease without esophagitis: Secondary | ICD-10-CM

## 2011-05-13 DIAGNOSIS — R110 Nausea: Secondary | ICD-10-CM | POA: Insufficient documentation

## 2011-05-13 LAB — TSH: TSH: 0.67 u[IU]/mL (ref 0.35–5.50)

## 2011-05-13 LAB — IBC PANEL
Iron: 81 ug/dL (ref 42–145)
Transferrin: 279.4 mg/dL (ref 212.0–360.0)

## 2011-05-13 MED ORDER — METOCLOPRAMIDE HCL 10 MG PO TABS: 10.0000 mg | ORAL_TABLET | Freq: Every day | ORAL | Status: AC

## 2011-05-13 MED ORDER — SUCRALFATE 1 GM/10ML PO SUSP: 1.0000 g | Freq: Four times a day (QID) | ORAL | Status: DC

## 2011-05-13 MED ORDER — DEXLANSOPRAZOLE 30 MG PO CPDR: 30.0000 mg | DELAYED_RELEASE_CAPSULE | Freq: Two times a day (BID) | ORAL | Status: DC

## 2011-05-13 NOTE — Patient Instructions (Signed)
Take Dexilant two times a day 30 min before breakfast and dinner.  Take Carafate as needed.  Take Reglan one tablet by mouth at bedtime.  Your procedure has been scheduled for 05/26/2011, please follow the seperate instructions.  Handout on propofol given Please go to the basement today for your labs.   Diet for GERD or PUD Nutrition therapy can help ease the discomfort of gastroesophageal reflux disease (GERD) and peptic ulcer disease (PUD).  HOME CARE INSTRUCTIONS   Eat your meals slowly, in a relaxed setting.   Eat 5 to 6 small meals per day.   If a food causes distress, stop eating it for a period of time.  FOODS TO AVOID  Coffee, regular or decaffeinated.   Cola beverages, regular or low calorie.   Tea, regular or decaffeinated.   Pepper.   Cocoa.   High fat foods, including meats.   Butter, margarine, hydrogenated oil (trans fats).   Peppermint or spearmint (if you have GERD).   Fruits and vegetables if not tolerated.   Alcohol.   Nicotine (smoking or chewing). This is one of the most potent stimulants to acid production in the gastrointestinal tract.   Any food that seems to aggravate your condition.  If you have questions regarding your diet, ask your caregiver or a registered dietitian. TIPS  Lying flat may make symptoms worse. Keep the head of your bed raised 6 to 9 inches (15 to 23 cm) by using a foam wedge or blocks under the legs of the bed.   Do not lay down until 3 hours after eating a meal.   Daily physical activity may help reduce symptoms.  MAKE SURE YOU:   Understand these instructions.   Will watch your condition.   Will get help right away if you are not doing well or get worse.  Document Released: 05/10/2005 Document Revised: 01/20/2011 Document Reviewed: 09/23/2008 [facility] Patient Information 2012 [place], [place].

## 2011-05-13 NOTE — Progress Notes (Signed)
History of Present Illness:  This is a very pleasant 40 year old [place] American female from the [place]. She speaks no [place], and is accompanied today by a [place] interpreter. The patient describes years of acid reflux, with severe exacerbation of the last 2 months described as burning substernal chest pain, regurgitation, nausea, and inability to work. She has not had previous evaluation, endoscopy or radiographs. Patient denies dysphagia, significant anorexia or weight loss. She does describe early satiety and postprandial gas and bloating. However, bowel movements are regular and she denies melena or hematochezia. Also there no systemic or hepatobiliary complaints. Patient does not smoke, abuse ethanol or alcohol. He currently is on Dexilant 30 mg a day when necessary tramadol. Apparently she has not been taking her medications reliably.  I have reviewed this patient's present history, medical and surgical past history, allergies and medications.     ROS: The remainder of the 10 point ROS is negative.. some insomnia because of regurgitation, also associated general malaise and fatigue. There is no history of previous hepatitis or pancreatitis, Raynaud phenomenon or any symptoms consistent with collagen vascular disease.     Physical Exam: General well developed well nourished patient in no acute distress, appearing her stated age Eyes PERRLA, no icterus, fundoscopic exam per opthamologist Skin no lesions noted Neck supple, no adenopathy, no thyroid enlargement, no tenderness Chest clear to percussion and auscultation Heart no significant murmurs, gallops or rubs noted Abdomen no hepatosplenomegaly masses or tenderness, BS normal.  Extremities no acute joint lesions, edema, phlebitis or evidence of cellulitis. Neurologic patient oriented x 3, cranial nerves intact, no focal neurologic deficits noted. Psychological mental status normal and normal affect.  Assessment and plan: Severe  acid reflux with a probable element of bilious reflux. Her symptoms do not seem consistent with symptomatic cholelithiasis. I have scheduled endoscopic exam, increased her Dexilant 2 twice a day dosage, Reglan 10 mg at bedtime, when necessary Carafate suspension and have reviewed standard antireflux maneuvers with the patient and her interpreter. Depending on her work up she may need upper abdominal ultrasound exam. The time of endoscopy we'll also check her for H. pylori infection. Labs ordered including amylase and lipase and liver profile.  Encounter Diagnoses  Name Primary?  . Esophageal reflux Yes  . Chest pain, atypical   . Nausea

## 2011-05-14 ENCOUNTER — Telehealth: Payer: Self-pay | Admitting: *Deleted

## 2011-05-14 LAB — BASIC METABOLIC PANEL
BUN: 12 mg/dL (ref 6–23)
GFR: 97.76 mL/min (ref >60.00)
Potassium: 4 mEq/L (ref 3.5–5.1)
Sodium: 140 mEq/L (ref 135–145)

## 2011-05-14 LAB — HEPATIC FUNCTION PANEL
Albumin: 4.1 g/dL (ref 3.5–5.2)
Alkaline Phosphatase: 72 U/L (ref 39–117)
Total Bilirubin: 0.5 mg/dL (ref 0.3–1.2)

## 2011-05-14 LAB — CBC WITH DIFFERENTIAL/PLATELET
Basophils Absolute: 0 10*3/uL (ref 0.0–0.1)
Eosinophils Relative: 2.6 % (ref 0.0–5.0)
HCT: 36.4 % (ref 36.0–46.0)
Hemoglobin: 12.4 g/dL (ref 12.0–15.0)
Lymphs Abs: 1.2 10*3/uL (ref 0.7–4.0)
MCV: 83.5 fl (ref 78.0–100.0)
Monocytes Relative: 6.4 % (ref 3.0–12.0)
Neutro Abs: 2.9 10*3/uL (ref 1.4–7.7)
RDW: 13.4 % (ref 11.5–14.6)

## 2011-05-14 LAB — SEDIMENTATION RATE: Sed Rate: 19 mm/hr (ref 0–22)

## 2011-05-14 LAB — CELIAC PANEL 10: IgA: 255 mg/dL (ref 69–380)

## 2011-05-14 NOTE — Telephone Encounter (Signed)
done

## 2011-05-20 LAB — CELIAC PANEL 10

## 2011-05-26 ENCOUNTER — Encounter: Payer: Self-pay | Admitting: Gastroenterology

## 2011-05-26 ENCOUNTER — Ambulatory Visit (AMBULATORY_SURGERY_CENTER): Payer: PRIVATE HEALTH INSURANCE | Admitting: Gastroenterology

## 2011-05-26 VITALS — BP 130/78 | HR 72 | Temp 96.8°F | Resp 22 | Ht 65.0 in | Wt 177.0 lb

## 2011-05-26 DIAGNOSIS — K2940 Chronic atrophic gastritis without bleeding: Secondary | ICD-10-CM

## 2011-05-26 DIAGNOSIS — K2970 Gastritis, unspecified, without bleeding: Secondary | ICD-10-CM

## 2011-05-26 DIAGNOSIS — R0789 Other chest pain: Secondary | ICD-10-CM

## 2011-05-26 DIAGNOSIS — K219 Gastro-esophageal reflux disease without esophagitis: Secondary | ICD-10-CM

## 2011-05-26 DIAGNOSIS — R109 Unspecified abdominal pain: Secondary | ICD-10-CM

## 2011-05-26 MED ORDER — SODIUM CHLORIDE 0.9 % IV SOLN: 500.0000 mL | INTRAVENOUS | Status: AC

## 2011-05-26 NOTE — Op Note (Signed)
[facility] [address] [place], [place]  [address]  ENDOSCOPY PROCEDURE REPORT  PATIENT:  [name] Norris, [name] Norris  MR#:  [id number] BIRTHDATE:  April 27, 1971, 40 yrs. old  GENDER:  female  ENDOSCOPIST:  Vania Rea. Jarold Motto, MD, [facility] Referred by:  PROCEDURE DATE:  05/26/2011 PROCEDURE:  EGD with biopsy, 43239, EGD with biopsy for H. pylori 08657 ASA CLASS:  Class II INDICATIONS:  ATYPICAL CHEST PAIN.NEGATIVE W/U AND NO RESPONSE TO RX.  MEDICATIONS:   propofol (Diprivan) 260 mg IV TOPICAL ANESTHETIC:  DESCRIPTION OF PROCEDURE:   After the risks and benefits of the procedure were explained, informed consent was obtained.  The LB [id number]-H180 [id number] endoscope was introduced through the mouth and advanced to the second portion of the duodenum.  The instrument was slowly withdrawn as the mucosa was fully examined. <<PROCEDUREIMAGES>>  Mild gastritis was found in the body of the stomach. CLO AND REGULAR BIOPSIES DONE.  Normal duodenal folds were noted. SI BIOPSIES DONE.  The esophagus and gastroesophageal junction were completely normal in appearance. NO ESOPHAGITIS,STRICTURE,ETC.BIOPSIES DONE.    Retroflexed views revealed no abnormalities.    The scope was then withdrawn from the patient and the procedure completed.  COMPLICATIONS:  None  ENDOSCOPIC IMPRESSION: 1) Mild gastritis in the body of the stomach 2) Normal duodenal folds 3) Normal esophagus FAIRLY NORMAL ENDOSCOPY.NO CAUSE FOR CHEST PAIN.NEEDS 1 CARE MD AND F/U/// RECOMMENDATIONS: 1) Await biopsy results 2) Rx CLO if positive 3) continue current meds CONSIDER PSYCHIATRY REFERRAL FROM 1 CARE.  ______________________________ Vania Rea. Jarold Motto, MD, [facility]  CC:  Margarita Grizzle, MD  n. Rosalie Doctor:   Vania Rea. Lajuanda Penick at 05/26/2011 03:34 PM  Campbell Riches, [phone]

## 2011-05-26 NOTE — Patient Instructions (Addendum)
See the picture page for your findings from your exam today.  Follow the green and blue discharge instruction sheets the rest of the day.  Resume your prior medications today. Please call if any questions or concerns.  

## 2011-05-26 NOTE — Progress Notes (Signed)
No complaints noted on discharge. Maw  Pt and her care partner had a lot of questions.  I answered them as I could. MAW  Per Dr. Jarold Motto pt to be set up with Dr. Debby Bud as her primary md.  Appoint was made by Titus Dubin on the 3rd floor.  After talking with the pt and her care partner, she already was established with a primary care md, but she could not tell their name.  I advised Dr. Jarold Motto of this and he said to cancel appoitment with Dr. Filbert Schilder and follow up with her primary md. Maw  Patient did not experience any of the following events: a burn prior to discharge; a fall within the facility; wrong site/side/patient/procedure/implant event; or a hospital transfer or hospital admission upon discharge from the facility. [phone]) Patient did not have preoperative order for IV antibiotic SSI prophylaxis. [phone])   This patient repeatedly asked for disability and work excuse. I have suggested to her that she followup with primary care and behavioral health. She's had multiple emergency room visits, multiple labs and tests all of which have been normal. I think formal psychiatric evaluation is indicated if not done.  DRP

## 2011-05-27 ENCOUNTER — Telehealth: Payer: Self-pay | Admitting: *Deleted

## 2011-05-27 ENCOUNTER — Telehealth: Payer: Self-pay

## 2011-05-27 DIAGNOSIS — K2970 Gastritis, unspecified, without bleeding: Secondary | ICD-10-CM

## 2011-05-27 DIAGNOSIS — K2990 Gastroduodenitis, unspecified, without bleeding: Secondary | ICD-10-CM

## 2011-05-27 LAB — HELICOBACTER PYLORI SCREEN-BIOPSY: UREASE: NEGATIVE

## 2011-05-27 NOTE — Telephone Encounter (Signed)
No one answered phone. No answering machine. Unable to leave message.

## 2011-05-27 NOTE — Telephone Encounter (Signed)
Received phone call from pts interpreter.  She states that pt continues to have chest pain.  The pain is no different than before procedure.  She did not have pt rate pain and is not with pt at this time.  Phoned Dr Jarold Motto in his office. He states that the EGD the pt had yesterday could not have caused the pain the pt is having.  Per his instruction, pt is to follow up with her primary MD.  Caller verbalizes understanding.

## 2011-05-31 ENCOUNTER — Encounter: Payer: Self-pay | Admitting: Gastroenterology

## 2011-06-01 ENCOUNTER — Encounter: Payer: Self-pay | Admitting: Gastroenterology

## 2011-06-19 ENCOUNTER — Emergency Department (HOSPITAL_COMMUNITY)
Admission: EM | Admit: 2011-06-19 | Discharge: 2011-06-19 | Disposition: A | Discharge status: Home / Self Care | Payer: PRIVATE HEALTH INSURANCE | Attending: Emergency Medicine | Admitting: Emergency Medicine

## 2011-06-19 ENCOUNTER — Other Ambulatory Visit: Payer: Self-pay

## 2011-06-19 ENCOUNTER — Encounter (HOSPITAL_COMMUNITY): Payer: Self-pay | Admitting: *Deleted

## 2011-06-19 DIAGNOSIS — Z79899 Other long term (current) drug therapy: Secondary | ICD-10-CM | POA: Insufficient documentation

## 2011-06-19 DIAGNOSIS — R0789 Other chest pain: Secondary | ICD-10-CM

## 2011-06-19 DIAGNOSIS — K219 Gastro-esophageal reflux disease without esophagitis: Secondary | ICD-10-CM | POA: Insufficient documentation

## 2011-06-19 LAB — URINALYSIS, ROUTINE W REFLEX MICROSCOPIC
Bilirubin Urine: NEGATIVE
Glucose, UA: NEGATIVE mg/dL
Hgb urine dipstick: NEGATIVE
Ketones, ur: NEGATIVE mg/dL
Nitrite: NEGATIVE
Protein, ur: NEGATIVE mg/dL
Specific Gravity, Urine: 1.013 (ref 1.005–1.030)
Urobilinogen, UA: 0.2 mg/dL (ref 0.0–1.0)
pH: 7.5 (ref 5.0–8.0)

## 2011-06-19 LAB — BASIC METABOLIC PANEL
BUN: 16 mg/dL (ref 6–23)
CO2: 28 mEq/L (ref 19–32)
Calcium: 10.1 mg/dL (ref 8.4–10.5)
Chloride: 104 mEq/L (ref 96–112)
Creatinine, Ser: 0.68 mg/dL (ref 0.50–1.10)
GFR calc Af Amer: >90 mL/min (ref >90)
GFR calc non Af Amer: >90 mL/min (ref >90)
Glucose, Bld: 103 mg/dL — ABNORMAL HIGH (ref 70–99)
Potassium: 3.7 mEq/L (ref 3.5–5.1)
Sodium: 140 mEq/L (ref 135–145)

## 2011-06-19 LAB — CBC
HCT: 37.5 % (ref 36.0–46.0)
Hemoglobin: 12.6 g/dL (ref 12.0–15.0)
MCH: 27.8 pg (ref 26.0–34.0)
MCHC: 33.6 g/dL (ref 30.0–36.0)
MCV: 82.6 fL (ref 78.0–100.0)
Platelets: 209 10*3/uL (ref 150–400)
RBC: 4.54 MIL/uL (ref 3.87–5.11)
RDW: 13.1 % (ref 11.5–15.5)
WBC: 7.6 10*3/uL (ref 4.0–10.5)

## 2011-06-19 LAB — URINE MICROSCOPIC-ADD ON

## 2011-06-19 MED ORDER — PREDNISONE 50 MG PO TABS: 50.0000 mg | ORAL_TABLET | Freq: Every day | ORAL | Status: AC

## 2011-06-19 MED ORDER — HYDROCODONE-ACETAMINOPHEN 5-325 MG PO TABS
ORAL_TABLET | ORAL | Status: AC
  Administered 2011-06-19: 1
  Filled 2011-06-19: qty 1

## 2011-06-19 MED ORDER — HYDROCODONE-ACETAMINOPHEN 5-325 MG PO TABS: 1.0000 | ORAL_TABLET | Freq: Four times a day (QID) | ORAL | Status: AC | PRN

## 2011-06-19 NOTE — ED Provider Notes (Signed)
Medical screening examination/treatment/procedure(s) were performed by non-physician practitioner and as supervising physician I was immediately available for consultation/collaboration.  Doug Sou, MD 06/19/11 [phone]

## 2011-06-19 NOTE — ED Notes (Signed)
Gave old and new ECG to Dr. Vonna Kotyk after I performed. 6:38 pm JG.

## 2011-06-19 NOTE — ED Provider Notes (Signed)
History     CSN: [id number]  Arrival date & time 06/19/11  4098   First MD Initiated Contact with Patient 06/19/11 1853      Chief Complaint  Patient presents with  . Chest Pain    LT sided    (Consider location/radiation/quality/duration/timing/severity/associated sxs/prior treatment) HPI Patient presents the emergency department with a two-month history of chest pain.  She states she has been seen multiple times for this by multiple specialties.  She has had several ER visits for this chest pain as well.  She denies any changes in character or type of her pain.  She states that the acid reflux medicine seems to make the pain worse.  Patient denies shortness of breath or nausea/vomiting, headache, abdominal pain, or diarrhea.  She states nothing seems to make the pain better. Past Medical History  Diagnosis Date  . Acid reflux   . Heart murmur     Past Surgical History  Procedure Date  . Cesarean section   . Middle ear surgery     x2    No family history on file.  History  Substance Use Topics  . Smoking status: Never Smoker   . Smokeless tobacco: Never Used  . Alcohol Use: No    OB History    Grav Para Term Preterm Abortions TAB SAB Ect Mult Living                  Review of Systems All pertinent positives and negatives reviewed in the history of present illness   Allergies  Review of patient's allergies indicates no known allergies.  Home Medications   Current Outpatient Rx  Name Route Sig Dispense Refill  . DEXLANSOPRAZOLE 30 MG PO CPDR Oral Take 1 capsule (30 mg total) by mouth 2 (two) times daily. 60 capsule 3  . SUCRALFATE 1 GM/10ML PO SUSP Oral Take 10 mLs (1 g total) by mouth 4 (four) times daily. 1200 mL 2  . TRAMADOL HCL 50 MG PO TABS Oral Take 50 mg by mouth every 6 (six) hours as needed. Maximum dose= 8 tablets per day       BP 124/105  Pulse 72  Temp(Src) 98.3 F (36.8 C) (Oral)  Resp 18  SpO2 98%  LMP 04/24/2011  Physical Exam    Constitutional: She is oriented to person, place, and time. She appears well-developed and well-nourished. No distress.  HENT:  Head: Normocephalic and atraumatic.  Eyes: EOM are normal. Pupils are equal, round, and reactive to light.  Neck: Normal range of motion. Neck supple.  Cardiovascular: Normal rate, regular rhythm and normal heart sounds.  Exam reveals no gallop and no friction rub.   No murmur heard. Pulmonary/Chest: Effort normal and breath sounds normal. No respiratory distress. She has no wheezes. She has no rales. She exhibits tenderness.  Abdominal: Soft. Bowel sounds are normal. She exhibits no distension. There is no tenderness. There is no rebound.  Neurological: She is alert and oriented to person, place, and time. No cranial nerve deficit. Coordination normal.  Skin: Skin is warm and dry. No rash noted. No erythema. No pallor.    ED Course  Procedures (including critical care time)  Patient does not have any signs or symptoms of this is consistent with cardiac chest pain.  Patient will be referred back to her primary care Dr. for further evaluation.  She is told to return here as needed for any worsening in her condition.  I reviewed all of her old records and  she has had thorough workup of this chest pain.    Patient has palpable pain to her left lateral chest wall.    MDM  MDM Reviewed: nursing note, vitals and previous chart Reviewed previous: labs, ECG and x-ray Interpretation: labs and ECG            Carlyle Dolly, PA-C 06/19/11 2304

## 2011-07-01 ENCOUNTER — Ambulatory Visit: Payer: PRIVATE HEALTH INSURANCE | Admitting: Internal Medicine

## 2011-07-06 ENCOUNTER — Ambulatory Visit: Payer: PRIVATE HEALTH INSURANCE | Admitting: Internal Medicine

## 2011-12-14 ENCOUNTER — Encounter (HOSPITAL_COMMUNITY): Payer: Self-pay | Admitting: *Deleted

## 2011-12-14 ENCOUNTER — Emergency Department (HOSPITAL_COMMUNITY)
Admission: EM | Admit: 2011-12-14 | Discharge: 2011-12-14 | Disposition: A | Discharge status: Home / Self Care | Payer: PRIVATE HEALTH INSURANCE | Attending: Emergency Medicine | Admitting: Emergency Medicine

## 2011-12-14 ENCOUNTER — Emergency Department (HOSPITAL_COMMUNITY): Payer: PRIVATE HEALTH INSURANCE

## 2011-12-14 DIAGNOSIS — R0789 Other chest pain: Secondary | ICD-10-CM

## 2011-12-14 DIAGNOSIS — K219 Gastro-esophageal reflux disease without esophagitis: Secondary | ICD-10-CM | POA: Insufficient documentation

## 2011-12-14 HISTORY — DX: Gastro-esophageal reflux disease without esophagitis: K21.9

## 2011-12-14 HISTORY — DX: Reserved for inherently not codable concepts without codable children: IMO0001

## 2011-12-14 LAB — COMPREHENSIVE METABOLIC PANEL
ALT: 14 U/L (ref 0–35)
AST: 21 U/L (ref 0–37)
Albumin: 4 g/dL (ref 3.5–5.2)
Alkaline Phosphatase: 60 U/L (ref 39–117)
CO2: 26 mEq/L (ref 19–32)
Chloride: 107 mEq/L (ref 96–112)
Creatinine, Ser: 0.66 mg/dL (ref 0.50–1.10)
GFR calc non Af Amer: >90 mL/min (ref >90)
Potassium: 4 mEq/L (ref 3.5–5.1)
Total Bilirubin: 0.2 mg/dL — ABNORMAL LOW (ref 0.3–1.2)

## 2011-12-14 LAB — CBC WITH DIFFERENTIAL/PLATELET
Basophils Absolute: 0 10*3/uL (ref 0.0–0.1)
Basophils Relative: 0 % (ref 0–1)
HCT: 36.8 % (ref 36.0–46.0)
Hemoglobin: 12.6 g/dL (ref 12.0–15.0)
Lymphocytes Relative: 42 % (ref 12–46)
Monocytes Absolute: 0.5 10*3/uL (ref 0.1–1.0)
Neutro Abs: 3.4 10*3/uL (ref 1.7–7.7)
Neutrophils Relative %: 48 % (ref 43–77)
RDW: 13.1 % (ref 11.5–15.5)
WBC: 7.1 10*3/uL (ref 4.0–10.5)

## 2011-12-14 LAB — URINALYSIS, ROUTINE W REFLEX MICROSCOPIC
Bilirubin Urine: NEGATIVE
Glucose, UA: NEGATIVE mg/dL
Hgb urine dipstick: NEGATIVE
Ketones, ur: NEGATIVE mg/dL
Protein, ur: NEGATIVE mg/dL
pH: 6.5 (ref 5.0–8.0)

## 2011-12-14 LAB — POCT I-STAT TROPONIN I

## 2011-12-14 LAB — URINE MICROSCOPIC-ADD ON

## 2011-12-14 MED ORDER — PANTOPRAZOLE SODIUM 20 MG PO TBEC: 40.0000 mg | DELAYED_RELEASE_TABLET | Freq: Every day | ORAL | Status: DC

## 2011-12-14 MED ORDER — GI COCKTAIL ~~LOC~~
30.0000 mL | Freq: Once | ORAL | Status: AC
  Administered 2011-12-14: 30 mL via ORAL
  Filled 2011-12-14: qty 30

## 2011-12-14 MED ORDER — FAMOTIDINE 20 MG PO TABS
20.0000 mg | ORAL_TABLET | Freq: Once | ORAL | Status: AC
  Administered 2011-12-14: 20 mg via ORAL
  Filled 2011-12-14: qty 1

## 2011-12-14 NOTE — ED Notes (Signed)
The pt has had mid-chest pain for the past 3 days with sob.  She has had in the past and was diagnosed with reflux

## 2011-12-14 NOTE — ED Notes (Signed)
Given work note.

## 2011-12-14 NOTE — ED Notes (Signed)
EDP at [facility] 

## 2011-12-14 NOTE — ED Notes (Addendum)
C/o epigastric pain (severe), radiates to mid and LUQ , also some back pain (denies: nvd, fever, sob), last BM yesterday (normal), primary language "[place]", sons speak [place].

## 2011-12-14 NOTE — ED Provider Notes (Signed)
History     CSN: [id number]  Arrival date & time 12/14/11  0047   First MD Initiated Contact with Patient 12/14/11 0236      Chief Complaint  Patient presents with  . Chest Pain    (Consider location/radiation/quality/duration/timing/severity/associated sxs/prior treatment) Patient is a 41 y.o. female presenting with chest pain. The history is provided by the patient.  Chest Pain The chest pain began 3 - 5 days ago. Duration of episode(s) is 3 days. Chest pain occurs constantly. The chest pain is unchanged. The pain is associated with eating. At its most intense, the pain is at 3/10. The pain is currently at 3/10. The severity of the pain is moderate. The quality of the pain is described as aching and burning. The pain does not radiate. Chest pain is worsened by eating. Primary symptoms include shortness of breath. Pertinent negatives for primary symptoms include no wheezing, no palpitations, no abdominal pain, no nausea, no vomiting and no altered mental status. She tried nothing for the symptoms. Risk factors include no known risk factors.     Past Medical History  Diagnosis Date  . Acid reflux   . Heart murmur   . Reflux     Past Surgical History  Procedure Date  . Cesarean section   . Middle ear surgery     x2    No family history on file.  History  Substance Use Topics  . Smoking status: Never Smoker   . Smokeless tobacco: Never Used  . Alcohol Use: No    OB History    Grav Para Term Preterm Abortions TAB SAB Ect Mult Living                  Review of Systems  Respiratory: Positive for shortness of breath. Negative for wheezing.   Cardiovascular: Positive for chest pain. Negative for palpitations and leg swelling.  Gastrointestinal: Negative for nausea, vomiting and abdominal pain.  Psychiatric/Behavioral: Negative for altered mental status.  All other systems reviewed and are negative.    Allergies  Review of patient's allergies indicates no known  allergies.  Home Medications  No current outpatient prescriptions on file.  BP 110/69  Pulse 70  Temp 98.2 F (36.8 C) (Oral)  Resp 16  SpO2 100%  LMP 11/30/2011  Physical Exam  Constitutional: She is oriented to person, place, and time. She appears well-developed and well-nourished.  HENT:  Head: Normocephalic and atraumatic.  Eyes: Conjunctivae and EOM are normal. Pupils are equal, round, and reactive to light.  Neck: Normal range of motion.  Cardiovascular: Normal rate, regular rhythm and normal heart sounds.   Pulmonary/Chest: Effort normal and breath sounds normal.  Abdominal: Soft. Bowel sounds are normal.  Musculoskeletal: Normal range of motion.  Neurological: She is alert and oriented to person, place, and time.  Skin: Skin is warm and dry.  Psychiatric: She has a normal mood and affect. Her behavior is normal.    ED Course  Procedures (including critical care time)  Labs Reviewed  COMPREHENSIVE METABOLIC PANEL - Abnormal; Notable for the following:    Glucose, Bld 107 (*)     Total Bilirubin 0.2 (*)     All other components within normal limits  URINALYSIS, ROUTINE W REFLEX MICROSCOPIC - Abnormal; Notable for the following:    Color, Urine STRAW (*)     Specific Gravity, Urine 1.004 (*)     Leukocytes, UA TRACE (*)     All other components within normal limits  CBC  WITH DIFFERENTIAL  TROPONIN I  POCT PREGNANCY, URINE  URINE MICROSCOPIC-ADD ON  POCT I-STAT TROPONIN I   Dg Chest 2 View  12/14/2011  *RADIOLOGY REPORT*  Clinical Data: Chest pain.  CHEST - 2 VIEW  Comparison: 04/16/2011.  Findings: The cardiac silhouette, mediastinal and hilar contours are within normal limits and stable. The lungs are clear.  No pleural effusions.  The bony thorax is intact.  IMPRESSION: Normal chest x-ray.  Original Report Authenticated By: P. Loralie Champagne, M.D.     No diagnosis found.   Date: 12/14/2011  Rate: 73  Rhythm: normal sinus rhythm  QRS Axis: normal   Intervals: normal  ST/T Wave abnormalities: normal  Conduction Disutrbances: none  Narrative Interpretation: unremarkable      MDM  + constant atypical burning cp x 3 days.  No pleuritic component.  No hx of clot,  Nor recent travel,  No leg swelling.  No tachycardia or hypoxemia.  ce neg x 2 improved with gi cocktail.  Will dc with ppi to fu with pmd,  Ret new/worsening sxs        Abijah Roussel Lytle Michaels, MD 12/14/11 [phone]

## 2013-01-18 ENCOUNTER — Emergency Department (HOSPITAL_COMMUNITY)
Admission: EM | Admit: 2013-01-18 | Discharge: 2013-01-18 | Disposition: A | Discharge status: Home / Self Care | Payer: PRIVATE HEALTH INSURANCE | Attending: Emergency Medicine | Admitting: Emergency Medicine

## 2013-01-18 ENCOUNTER — Emergency Department (HOSPITAL_COMMUNITY): Payer: PRIVATE HEALTH INSURANCE

## 2013-01-18 ENCOUNTER — Encounter (HOSPITAL_COMMUNITY): Payer: Self-pay | Admitting: *Deleted

## 2013-01-18 DIAGNOSIS — R51 Headache: Secondary | ICD-10-CM | POA: Insufficient documentation

## 2013-01-18 DIAGNOSIS — S139XXA Sprain of joints and ligaments of unspecified parts of neck, initial encounter: Secondary | ICD-10-CM | POA: Insufficient documentation

## 2013-01-18 DIAGNOSIS — S8391XA Sprain of unspecified site of right knee, initial encounter: Secondary | ICD-10-CM

## 2013-01-18 DIAGNOSIS — Y9289 Other specified places as the place of occurrence of the external cause: Secondary | ICD-10-CM | POA: Insufficient documentation

## 2013-01-18 DIAGNOSIS — S20219A Contusion of unspecified front wall of thorax, initial encounter: Secondary | ICD-10-CM | POA: Insufficient documentation

## 2013-01-18 DIAGNOSIS — Z79899 Other long term (current) drug therapy: Secondary | ICD-10-CM | POA: Insufficient documentation

## 2013-01-18 DIAGNOSIS — S161XXA Strain of muscle, fascia and tendon at neck level, initial encounter: Secondary | ICD-10-CM

## 2013-01-18 DIAGNOSIS — S20212A Contusion of left front wall of thorax, initial encounter: Secondary | ICD-10-CM

## 2013-01-18 DIAGNOSIS — IMO0002 Reserved for concepts with insufficient information to code with codable children: Secondary | ICD-10-CM | POA: Insufficient documentation

## 2013-01-18 DIAGNOSIS — Y9389 Activity, other specified: Secondary | ICD-10-CM | POA: Insufficient documentation

## 2013-01-18 MED ORDER — HYDROCODONE-ACETAMINOPHEN 5-325 MG PO TABS: 1.0000 | ORAL_TABLET | Freq: Four times a day (QID) | ORAL | Status: DC | PRN

## 2013-01-18 MED ORDER — OXYCODONE-ACETAMINOPHEN 5-325 MG PO TABS
1.0000 | ORAL_TABLET | Freq: Once | ORAL | Status: AC
  Administered 2013-01-18: 1 via ORAL
  Filled 2013-01-18: qty 1

## 2013-01-18 MED ORDER — NAPROXEN 500 MG PO TABS: 500.0000 mg | ORAL_TABLET | Freq: Two times a day (BID) | ORAL | Status: DC

## 2013-01-18 MED ORDER — CYCLOBENZAPRINE HCL 10 MG PO TABS: 10.0000 mg | ORAL_TABLET | Freq: Three times a day (TID) | ORAL | Status: DC | PRN

## 2013-01-18 MED ORDER — DIAZEPAM 5 MG PO TABS
5.0000 mg | ORAL_TABLET | Freq: Once | ORAL | Status: AC
  Administered 2013-01-18: 5 mg via ORAL
  Filled 2013-01-18: qty 1
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
## 2013-01-20 ENCOUNTER — Emergency Department (HOSPITAL_COMMUNITY)
Admission: EM | Admit: 2013-01-20 | Discharge: 2013-01-20 | Disposition: A | Discharge status: Home / Self Care | Payer: PRIVATE HEALTH INSURANCE | Attending: Emergency Medicine | Admitting: Emergency Medicine

## 2013-01-20 ENCOUNTER — Encounter (HOSPITAL_COMMUNITY): Payer: Self-pay | Admitting: Emergency Medicine

## 2013-01-20 ENCOUNTER — Emergency Department (HOSPITAL_COMMUNITY): Payer: PRIVATE HEALTH INSURANCE

## 2013-01-20 DIAGNOSIS — S8000XA Contusion of unspecified knee, initial encounter: Secondary | ICD-10-CM | POA: Insufficient documentation

## 2013-01-20 DIAGNOSIS — Y92410 Unspecified street and highway as the place of occurrence of the external cause: Secondary | ICD-10-CM | POA: Insufficient documentation

## 2013-01-20 DIAGNOSIS — Z3202 Encounter for pregnancy test, result negative: Secondary | ICD-10-CM | POA: Insufficient documentation

## 2013-01-20 DIAGNOSIS — R011 Cardiac murmur, unspecified: Secondary | ICD-10-CM | POA: Insufficient documentation

## 2013-01-20 DIAGNOSIS — S139XXA Sprain of joints and ligaments of unspecified parts of neck, initial encounter: Secondary | ICD-10-CM | POA: Insufficient documentation

## 2013-01-20 DIAGNOSIS — Z791 Long term (current) use of non-steroidal anti-inflammatories (NSAID): Secondary | ICD-10-CM | POA: Insufficient documentation

## 2013-01-20 DIAGNOSIS — Y9389 Activity, other specified: Secondary | ICD-10-CM | POA: Insufficient documentation

## 2013-01-20 DIAGNOSIS — S8001XA Contusion of right knee, initial encounter: Secondary | ICD-10-CM

## 2013-01-20 DIAGNOSIS — Z8719 Personal history of other diseases of the digestive system: Secondary | ICD-10-CM | POA: Insufficient documentation

## 2013-01-20 DIAGNOSIS — S161XXA Strain of muscle, fascia and tendon at neck level, initial encounter: Secondary | ICD-10-CM

## 2013-01-20 LAB — POCT PREGNANCY, URINE: Preg Test, Ur: NEGATIVE

## 2013-01-20 MED ORDER — IBUPROFEN 200 MG PO TABS
600.0000 mg | ORAL_TABLET | Freq: Once | ORAL | Status: AC
  Administered 2013-01-20: 600 mg via ORAL
  Filled 2013-01-20: qty 3

## 2013-01-20 MED ORDER — ACETAMINOPHEN-CODEINE #3 300-30 MG PO TABS: 1.0000 | ORAL_TABLET | Freq: Four times a day (QID) | ORAL | Status: DC | PRN
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
## 2013-01-20 NOTE — ED Notes (Signed)
Family at bedside. 

## 2013-01-20 NOTE — Discharge Instructions (Signed)
Cervical Sprain °A cervical sprain is an injury in the neck in which the ligaments are stretched or torn. The ligaments are the tissues that hold the bones of the neck (vertebrae) in place. Cervical sprains can range from very mild to very severe. Most cervical sprains get better in 1 to 3 weeks, but it depends on the cause and extent of the injury. Severe cervical sprains can cause the neck vertebrae to be unstable. This can lead to damage of the spinal cord and can result in serious nervous system problems. Your caregiver will determine whether your cervical sprain is mild or severe. °CAUSES  °Severe cervical sprains may be caused by: °· Contact sport injuries (football, rugby, wrestling, hockey, auto racing, gymnastics, diving, martial arts, boxing). °· Motor vehicle collisions. °· Whiplash injuries. This means the neck is forcefully whipped backward and forward. °· Falls. °Mild cervical sprains may be caused by:  °· Awkward positions, such as cradling a telephone between your ear and shoulder. °· Sitting in a chair that does not offer proper support. °· Working at a poorly designed computer station. °· Activities that require looking up or down for long periods of time. °SYMPTOMS  °· Pain, soreness, stiffness, or a burning sensation in the front, back, or sides of the neck. This discomfort may develop immediately after injury or it may develop slowly and not begin for 24 hours or more after an injury. °· Pain or tenderness directly in the middle of the back of the neck. °· Shoulder or upper back pain. °· Limited ability to move the neck. °· Headache. °· Dizziness. °· Weakness, numbness, or tingling in the hands or arms. °· Muscle spasms. °· Difficulty swallowing or chewing. °· Tenderness and swelling of the neck. °DIAGNOSIS  °Most of the time, your caregiver can diagnose this problem by taking your history and doing a physical exam. Your caregiver will ask about any known problems, such as arthritis in the neck  or a previous neck injury. X-rays may be taken to find out if there are any other problems, such as problems with the bones of the neck. However, an X-ray often does not reveal the full extent of a cervical sprain. Other tests such as a computed tomography (CT) scan or magnetic resonance imaging (MRI) may be needed. °TREATMENT  °Treatment depends on the severity of the cervical sprain. Mild sprains can be treated with rest, keeping the neck in place (immobilization), and pain medicines. Severe cervical sprains need immediate immobilization and an appointment with an orthopedist or neurosurgeon. Several treatment options are available to help with pain, muscle spasms, and other symptoms. Your caregiver may prescribe: °· Medicines, such as pain relievers, numbing medicines, or muscle relaxants. °· Physical therapy. This can include stretching exercises, strengthening exercises, and posture training. Exercises and improved posture can help stabilize the neck, strengthen muscles, and help stop symptoms from returning. °· A neck collar to be worn for short periods of time. Often, these collars are worn for comfort. However, certain collars may be worn to protect the neck and prevent further worsening of a serious cervical sprain. °HOME CARE INSTRUCTIONS  °· Put ice on the injured area. °· Put ice in a plastic bag. °· Place a towel between your skin and the bag. °· Leave the ice on for 15-20 minutes, 3-4 times a day. °· Only take over-the-counter or prescription medicines for pain, discomfort, or fever as directed by your caregiver. °· Keep all follow-up appointments as directed by your caregiver. °· Keep all   physical therapy appointments as directed by your caregiver. °· If a neck collar is prescribed, wear it as directed by your caregiver. °· Do not drive while wearing a neck collar. °· Make any needed adjustments to your work station to promote good posture. °· Avoid positions and activities that make your symptoms  worse. °· Warm up and stretch before being active to help prevent problems. °SEEK MEDICAL CARE IF:  °· Your pain is not controlled with medicine. °· You are unable to decrease your pain medicine over time as planned. °· Your activity level is not improving as expected. °SEEK IMMEDIATE MEDICAL CARE IF:  °· You develop any bleeding, stomach upset, or signs of an allergic reaction to your medicine. °· Your symptoms get worse. °· You develop new, unexplained symptoms. °· You have numbness, tingling, weakness, or paralysis in any part of your body. °MAKE SURE YOU:  °· Understand these instructions. °· Will watch your condition. °· Will get help right away if you are not doing well or get worse. °Document Released: 03/07/2007 Document Revised: 08/02/2011 Document Reviewed: 02/10/2011 °[employer]® Patient Information ©2014 [employer]. ° °Contusion °A contusion is a deep bruise. Contusions are the result of an injury that caused bleeding under the skin. The contusion may turn blue, purple, or yellow. Minor injuries will give you a painless contusion, but more severe contusions may stay painful and swollen for a few weeks.  °CAUSES  °A contusion is usually caused by a blow, trauma, or direct force to an area of the body. °SYMPTOMS  °· Swelling and redness of the injured area. °· Bruising of the injured area. °· Tenderness and soreness of the injured area. °· Pain. °DIAGNOSIS  °The diagnosis can be made by taking a history and physical exam. An X-ray, CT scan, or MRI may be needed to determine if there were any associated injuries, such as fractures. °TREATMENT  °Specific treatment will depend on what area of the body was injured. In general, the best treatment for a contusion is resting, icing, elevating, and applying cold compresses to the injured area. Over-the-counter medicines may also be recommended for pain control. Ask your caregiver what the best treatment is for your contusion. °HOME CARE INSTRUCTIONS  °· Put ice  on the injured area. °· Put ice in a plastic bag. °· Place a towel between your skin and the bag. °· Leave the ice on for 15-20 minutes, 3-4 times a day. °· Only take over-the-counter or prescription medicines for pain, discomfort, or fever as directed by your caregiver. Your caregiver may recommend avoiding anti-inflammatory medicines (aspirin, ibuprofen, and naproxen) for 48 hours because these medicines may increase bruising. °· Rest the injured area. °· If possible, elevate the injured area to reduce swelling. °SEEK IMMEDIATE MEDICAL CARE IF:  °· You have increased bruising or swelling. °· You have pain that is getting worse. °· Your swelling or pain is not relieved with medicines. °MAKE SURE YOU:  °· Understand these instructions. °· Will watch your condition. °· Will get help right away if you are not doing well or get worse. °Document Released: 02/17/2005 Document Revised: 08/02/2011 Document Reviewed: 03/15/2011 °[employer]® Patient Information ©2014 [employer]. ° °

## 2013-01-20 NOTE — ED Notes (Addendum)
Dr. Oletta Lamas cleared pt. From backboard.

## 2013-01-20 NOTE — ED Notes (Signed)
XR notified patient ready for imaging.
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
## 2013-01-20 NOTE — ED Notes (Signed)
Patient returned from XR. 

## 2013-01-20 NOTE — ED Notes (Signed)
Pt. Refused vitals.

## 2013-01-22 NOTE — ED Provider Notes (Signed)
Medical screening examination/treatment/procedure(s) were performed by non-physician practitioner and as supervising physician I was immediately available for consultation/collaboration.  Rheannon Cerney T Kelle Ruppert, MD 01/22/13 0829 

## 2013-01-23 ENCOUNTER — Encounter (HOSPITAL_COMMUNITY): Payer: Self-pay | Admitting: Emergency Medicine

## 2015-05-06 ENCOUNTER — Emergency Department (HOSPITAL_COMMUNITY): Payer: PRIVATE HEALTH INSURANCE

## 2015-05-06 ENCOUNTER — Emergency Department (HOSPITAL_COMMUNITY)
Admission: EM | Admit: 2015-05-06 | Discharge: 2015-05-07 | Disposition: A | Discharge status: Home / Self Care | Payer: PRIVATE HEALTH INSURANCE | Attending: Emergency Medicine | Admitting: Emergency Medicine

## 2015-05-06 ENCOUNTER — Encounter (HOSPITAL_COMMUNITY): Payer: Self-pay | Admitting: Emergency Medicine

## 2015-05-06 DIAGNOSIS — W01198A Fall on same level from slipping, tripping and stumbling with subsequent striking against other object, initial encounter: Secondary | ICD-10-CM | POA: Insufficient documentation

## 2015-05-06 DIAGNOSIS — Y998 Other external cause status: Secondary | ICD-10-CM | POA: Diagnosis not present

## 2015-05-06 DIAGNOSIS — S3992XA Unspecified injury of lower back, initial encounter: Secondary | ICD-10-CM | POA: Insufficient documentation

## 2015-05-06 DIAGNOSIS — Z8719 Personal history of other diseases of the digestive system: Secondary | ICD-10-CM | POA: Insufficient documentation

## 2015-05-06 DIAGNOSIS — M545 Low back pain, unspecified: Secondary | ICD-10-CM

## 2015-05-06 DIAGNOSIS — Y9289 Other specified places as the place of occurrence of the external cause: Secondary | ICD-10-CM | POA: Diagnosis not present

## 2015-05-06 DIAGNOSIS — S8991XA Unspecified injury of right lower leg, initial encounter: Secondary | ICD-10-CM | POA: Diagnosis present

## 2015-05-06 DIAGNOSIS — W19XXXA Unspecified fall, initial encounter: Secondary | ICD-10-CM

## 2015-05-06 DIAGNOSIS — Y9389 Activity, other specified: Secondary | ICD-10-CM | POA: Insufficient documentation

## 2015-05-06 DIAGNOSIS — R011 Cardiac murmur, unspecified: Secondary | ICD-10-CM | POA: Diagnosis not present

## 2015-05-06 DIAGNOSIS — Z79899 Other long term (current) drug therapy: Secondary | ICD-10-CM | POA: Insufficient documentation

## 2015-05-06 DIAGNOSIS — M25561 Pain in right knee: Secondary | ICD-10-CM

## 2015-05-06 NOTE — ED Notes (Signed)
Called pt 2X to be taken back to [facility] with no response.

## 2015-05-06 NOTE — ED Notes (Signed)
Pt. tripped and fell at home yesterday , denies LOC/ambulatory , reports pain at right knee and low back pain , denies hematuria or fever.

## 2015-05-07 MED ORDER — NAPROXEN 250 MG PO TABS
ORAL_TABLET | ORAL | Status: AC
  Filled 2015-05-07: qty 2

## 2015-05-07 MED ORDER — NAPROXEN 500 MG PO TABS
500.0000 mg | ORAL_TABLET | Freq: Two times a day (BID) | ORAL | Status: DC
Start: 2015-05-07 — End: 2016-04-07

## 2015-05-07 MED ORDER — OXYCODONE-ACETAMINOPHEN 5-325 MG PO TABS: 2.0000 | ORAL_TABLET | ORAL | Status: DC | PRN

## 2015-05-07 MED ORDER — OXYCODONE-ACETAMINOPHEN 5-325 MG PO TABS
ORAL_TABLET | ORAL | Status: AC
  Filled 2015-05-07: qty 1

## 2015-05-07 NOTE — ED Provider Notes (Signed)
CSN: [id number]     Arrival date & time 05/06/15  2248 History   First MD Initiated Contact with Patient 05/07/15 0010     Chief Complaint  Patient presents with  . Fall  . Back Pain  . Knee Pain    (Consider location/radiation/quality/duration/timing/severity/associated sxs/prior Treatment) Patient is a 44 y.o. female presenting with fall, back pain, and knee pain. The history is provided by the patient and a relative. No language interpreter was used.  Fall Associated symptoms include joint swelling.  Back Pain Knee Pain Associated symptoms: back pain   Ms. Kalbfleisch is a 44 y.o female with no significant pertinent medical history pertaining to complaint who presents with trip and fall at home yesterday while mowing the lawn. She states she fell on her right knee but was able to get up and ambulate. She denies any loss of consciousness or head injury. She states that she took ibuprofen and Advil for pain. She is also complaining of back pain 1 month. She denies any lifting or previous injury. She denies any bowel or bladder incontinence or retention, history of cancer, IV drug use, lower extremity numbness or weakness.  Past Medical History  Diagnosis Date  . Acid reflux   . Heart murmur   . Reflux    Past Surgical History  Procedure Laterality Date  . Cesarean section    . Middle ear surgery      x2   No family history on file. Social History  Substance Use Topics  . Smoking status: Never Smoker   . Smokeless tobacco: Never Used  . Alcohol Use: No   OB History    No data available     Review of Systems  Musculoskeletal: Positive for back pain, joint swelling and gait problem.  Skin: Negative for wound.      Allergies  Review of patient's allergies indicates no known allergies.  Home Medications   Prior to Admission medications   Medication Sig Start Date End Date Taking? Authorizing Provider  acetaminophen-codeine (TYLENOL #3) 300-30 MG per tablet  Take 1-2 tablets by mouth every 6 (six) hours as needed for pain. 01/20/13   Quita Skye, MD  ALPRAZolam [name]) 1 MG tablet Take 1 mg by mouth 3 (three) times daily.    Historical Provider, MD  cyclobenzaprine (FLEXERIL) 10 MG tablet Take 1 tablet (10 mg total) by mouth 3 (three) times daily as needed for muscle spasms. 01/18/13   Tatyana Kirichenko, PA-C  HYDROcodone-acetaminophen (NORCO) 5-325 MG per tablet Take 1 tablet by mouth every 6 (six) hours as needed for pain. 01/18/13   Tatyana Kirichenko, PA-C  naproxen (NAPROSYN) 500 MG tablet Take 1 tablet (500 mg total) by mouth 2 (two) times daily. 05/07/15   Kolyn Rozario Patel-Mills, PA-C  oxyCODONE-acetaminophen (PERCOCET/ROXICET) 5-325 MG tablet Take 2 tablets by mouth every 4 (four) hours as needed for severe pain. 05/07/15   Alnisa Hasley Patel-Mills, PA-C  PARoxetine (PAXIL) 20 MG tablet Take 20 mg by mouth every morning.    Historical Provider, MD   BP 123/76 mmHg  Pulse 73  Temp(Src) 98.2 F (36.8 C) (Oral)  Resp 16  SpO2 100% Physical Exam  Constitutional: She is oriented to person, place, and time. She appears well-developed and well-nourished. No distress.  HENT:  Head: Normocephalic and atraumatic.  Eyes: Conjunctivae are normal.  Neck: Normal range of motion. Neck supple.  Cardiovascular: Normal rate.   Pulmonary/Chest: Effort normal.  Musculoskeletal: She exhibits edema and tenderness.  Right-sided knee effusion. Tenderness to  the entire knee including the patella. No fibular head tenderness. Ambulatory with limping gait while taking 4 steps in the ED.   No midline lumbar vertebral tenderness. No reproducible pain. No weakness in her lower extremities. Able to dorsi and plantar flex. Saddle anesthesia.  Neurological: She is alert and oriented to person, place, and time.  Skin: Skin is warm and dry.  Psychiatric: She has a normal mood and affect.  Nursing note and vitals reviewed.   ED Course  Procedures (including critical care  time) Labs Review Labs Reviewed - No data to display  Imaging Review Dg Lumbar Spine Complete  05/06/2015  CLINICAL DATA:  Larey SeatFell yesterday. Low back and right knee pain. Difficulty with weight-bearing. EXAM: LUMBAR SPINE - COMPLETE 4+ VIEW COMPARISON:  10/02/2007 FINDINGS: Normal alignment of the lumbar spine. Mild degenerative changes with narrowed interspace and endplate hypertrophic changes at L4-5. No vertebral compression deformities. No focal bone lesion or bone destruction. Bone cortex appears intact. No significant changes since previous study. IMPRESSION: Mild degenerative changes at L4-5.  No acute bony abnormalities. Electronically Signed   By: Burman NievesWilliam  Stevens M.D.   On: 05/06/2015 23:36   Dg Knee Complete 4 Views Right  05/06/2015  CLINICAL DATA:  Initial encounter for Fall yesterday Lower back and right knee pain. Pt having difficulty with weight bearing. EXAM: RIGHT KNEE - COMPLETE 4+ VIEW COMPARISON:  01/20/2013 FINDINGS: No acute fracture or dislocation. Mild irregularity about the medial tibial plateau is similar and felt to be degenerative. There is also mild patellofemoral joint space narrowing. Small suprapatellar joint effusion. IMPRESSION: No acute osseous abnormality. 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 Electronically Signed   By: Jeronimo GreavesKyle  Talbot M.D.   On: 05/06/2015 23:34   I have personally reviewed and evaluated these image results as part of my medical decision-making.   EKG Interpretation None      MDM   Final diagnoses:  Right knee pain  Bilateral low back pain without sciatica  Fall, initial encounter  Patient presents for right knee pain after trip and fall while mowing the lawn. She also complains of back pain 1 month. She was given ice in the ED for right knee effusion. X-ray of the right knee shows mild osteoarthritis with suprapatellar joint effusion. Patient was able to straight leg raise but minimally secondary to pain. She may  have ligament injury or tear. She was given knee immobilizer and told to follow-up with orthopedics. X-ray of her lumbar spine shows mild degenerative changes at L4-5 but no bony abnormalities. She had no neurological deficits or signs of epidural abscess. I discussed follow-up with the patient. She was given 500 mg of naproxen and Percocet for pain. Her and her son verbally agree with the plan. She was given naproxen and Percocet while in the ED. There was epic down time and follow up was hand written as well as prescription and physical exam.    Catha GosselinHanna Patel-Mills, PA-C 05/07/15 2108  Raeford RazorStephen Kohut, MD 05/13/15 [phone]

## 2015-05-07 NOTE — ED Notes (Signed)
See downtime forms.

## 2015-05-07 NOTE — Discharge Instructions (Signed)
Back Pain, Adult °Back pain is very common in adults. The cause of back pain is rarely dangerous and the pain often gets better over time. The cause of your back pain may not be known. Some common causes of back pain include: °· Strain of the muscles or ligaments supporting the spine. °· Wear and tear (degeneration) of the spinal disks. °· Arthritis. °· Direct injury to the back. °For many people, back pain may return. Since back pain is rarely dangerous, most people can learn to manage this condition on their own. °HOME CARE INSTRUCTIONS °Watch your back pain for any changes. The following actions may help to lessen any discomfort you are feeling: °· Remain active. It is stressful on your back to sit or stand in one place for long periods of time. Do not sit, drive, or stand in one place for more than 30 minutes at a time. Take short walks on even surfaces as soon as you are able. Try to increase the length of time you walk each day. °· Exercise regularly as directed by your health care provider. Exercise helps your back heal faster. It also helps avoid future injury by keeping your muscles strong and flexible. °· Do not stay in bed. Resting more than 1-2 days can delay your recovery. °· Pay attention to your body when you bend and lift. The most comfortable positions are those that put less stress on your recovering back. Always use proper lifting techniques, including: °· Bending your knees. °· Keeping the load close to your body. °· Avoiding twisting. °· Find a comfortable position to sleep. Use a firm mattress and lie on your side with your knees slightly bent. If you lie on your back, put a pillow under your knees. °· Avoid feeling anxious or stressed. Stress increases muscle tension and can worsen back pain. It is important to recognize when you are anxious or stressed and learn ways to manage it, such as with exercise. °· Take medicines only as directed by your health care provider. Over-the-counter  medicines to reduce pain and inflammation are often the most helpful. Your health care provider may prescribe muscle relaxant drugs. These medicines help dull your pain so you can more quickly return to your normal activities and healthy exercise. °· Apply ice to the injured area: °· Put ice in a plastic bag. °· Place a towel between your skin and the bag. °· Leave the ice on for 20 minutes, 2-3 times a day for the first 2-3 days. After that, ice and heat may be alternated to reduce pain and spasms. °· Maintain a healthy weight. Excess weight puts extra stress on your back and makes it difficult to maintain good posture. °SEEK MEDICAL CARE IF: °· You have pain that is not relieved with rest or medicine. °· You have increasing pain going down into the legs or buttocks. °· You have pain that does not improve in one week. °· You have night pain. °· You lose weight. °· You have a fever or chills. °SEEK IMMEDIATE MEDICAL CARE IF:  °· You develop new bowel or bladder control problems. °· You have unusual weakness or numbness in your arms or legs. °· You develop nausea or vomiting. °· You develop abdominal pain. °· You feel faint. °  °This information is not intended to replace advice given to you by your health care provider. Make sure you discuss any questions you have with your health care provider. °  °Document Released: 05/10/2005 Document Revised: 05/31/2014 Document Reviewed: 09/11/2013 °Elsevier Interactive Patient Education ©2016 [employer]   [employer] °Knee Pain °Knee pain is a very common symptom and can have many causes. Knee pain often goes away when you follow your health care provider's instructions for relieving pain and discomfort at home. However, knee pain can develop into a condition that needs treatment. Some conditions may include: °Arthritis caused by wear and tear (osteoarthritis). °Arthritis caused by swelling and irritation (rheumatoid arthritis or gout). °A cyst or growth in your knee. °An infection in  your knee joint. °An injury that will not heal. °Damage, swelling, or irritation of the tissues that support your knee (torn ligaments or tendinitis). °If your knee pain continues, additional tests may be ordered to diagnose your condition. Tests may include X-rays or other imaging studies of your knee. You may also need to have fluid removed from your knee. Treatment for ongoing knee pain depends on the cause, but treatment may include: °Medicines to relieve pain or swelling. °Steroid injections in your knee. °Physical therapy. °Surgery. °HOME CARE INSTRUCTIONS °Take medicines only as directed by your health care provider. °Rest your knee and keep it raised (elevated) while you are resting. °Do not do things that cause or worsen pain. °Avoid high-impact activities or exercises, such as running, jumping rope, or doing jumping jacks. °Apply ice to the knee area: °Put ice in a plastic bag. °Place a towel between your skin and the bag. °Leave the ice on for 20 minutes, 2-3 times a day. °Ask your health care provider if you should wear an elastic knee support. °Keep a pillow under your knee when you sleep. °Lose weight if you are overweight. Extra weight can put pressure on your knee. °Do not use any tobacco products, including cigarettes, chewing tobacco, or electronic cigarettes. If you need help quitting, ask your health care provider. Smoking may slow the healing of any bone and joint problems that you may have. °SEEK MEDICAL CARE IF: °Your knee pain continues, changes, or gets worse. °You have a fever along with knee pain. °Your knee buckles or locks up. °Your knee becomes more swollen. °SEEK IMMEDIATE MEDICAL CARE IF:  °Your knee joint feels hot to the touch. °You have chest pain or trouble breathing. °  °This information is not intended to replace advice given to you by your health care provider. Make sure you discuss any questions you have with your health care provider. °  °Document Released: 03/07/2007  Document Revised: 05/31/2014 Document Reviewed: 12/24/2013 °Elsevier Interactive Patient Education ©2016 [employer]. ° °

## 2015-05-27 ENCOUNTER — Other Ambulatory Visit: Payer: Self-pay | Admitting: Family Medicine

## 2015-05-27 ENCOUNTER — Ambulatory Visit
Admission: RE | Admit: 2015-05-27 | Discharge: 2015-05-27 | Disposition: A | Discharge status: Home / Self Care | Payer: PRIVATE HEALTH INSURANCE | Source: Ambulatory Visit | Attending: Family Medicine | Admitting: Family Medicine

## 2015-05-27 DIAGNOSIS — M25562 Pain in left knee: Principal | ICD-10-CM

## 2015-05-27 DIAGNOSIS — M25561 Pain in right knee: Secondary | ICD-10-CM

## 2015-07-31 ENCOUNTER — Emergency Department (HOSPITAL_COMMUNITY)
Admission: EM | Admit: 2015-07-31 | Discharge: 2015-07-31 | Disposition: A | Discharge status: Home / Self Care | Payer: Self-pay | Attending: Emergency Medicine | Admitting: Emergency Medicine

## 2015-07-31 ENCOUNTER — Encounter (HOSPITAL_COMMUNITY): Payer: Self-pay | Admitting: Cardiology

## 2015-07-31 ENCOUNTER — Emergency Department (HOSPITAL_COMMUNITY): Payer: Self-pay

## 2015-07-31 DIAGNOSIS — R059 Cough, unspecified: Secondary | ICD-10-CM

## 2015-07-31 DIAGNOSIS — J069 Acute upper respiratory infection, unspecified: Secondary | ICD-10-CM | POA: Insufficient documentation

## 2015-07-31 DIAGNOSIS — R011 Cardiac murmur, unspecified: Secondary | ICD-10-CM | POA: Insufficient documentation

## 2015-07-31 DIAGNOSIS — Z8719 Personal history of other diseases of the digestive system: Secondary | ICD-10-CM | POA: Insufficient documentation

## 2015-07-31 DIAGNOSIS — Z79899 Other long term (current) drug therapy: Secondary | ICD-10-CM | POA: Insufficient documentation

## 2015-07-31 DIAGNOSIS — R079 Chest pain, unspecified: Secondary | ICD-10-CM | POA: Insufficient documentation

## 2015-07-31 DIAGNOSIS — Z791 Long term (current) use of non-steroidal anti-inflammatories (NSAID): Secondary | ICD-10-CM | POA: Insufficient documentation

## 2015-07-31 DIAGNOSIS — R05 Cough: Secondary | ICD-10-CM

## 2015-07-31 DIAGNOSIS — B9789 Other viral agents as the cause of diseases classified elsewhere: Secondary | ICD-10-CM

## 2015-07-31 MED ORDER — FLUTICASONE PROPIONATE 50 MCG/ACT NA SUSP: 2.0000 | Freq: Every day | NASAL | Status: DC

## 2015-07-31 MED ORDER — BENZONATATE 100 MG PO CAPS: 100.0000 mg | ORAL_CAPSULE | Freq: Three times a day (TID) | ORAL | Status: DC

## 2015-07-31 MED ORDER — BENZONATATE 100 MG PO CAPS
100.0000 mg | ORAL_CAPSULE | Freq: Once | ORAL | Status: AC
  Administered 2015-07-31: 100 mg via ORAL
  Filled 2015-07-31: qty 1

## 2015-07-31 NOTE — Discharge Instructions (Signed)

## 2015-07-31 NOTE — ED Notes (Signed)
Pt reports cough and fever for the past 4 days. Denies any n/v/abd pain at triage.

## 2015-07-31 NOTE — ED Provider Notes (Signed)
CSN: [id number]     Arrival date & time 07/31/15  [id number] History  By signing my name below, I, Doreatha MartinEva Mathews, attest that this documentation has been prepared under the direction and in the presence of Eyvonne MechanicJeffrey Ahamed Hofland, PA-C,   Electronically Signed: Doreatha MartinEva Mathews, ED Scribe. 07/31/2015. 10:56 AM.    Chief Complaint  Patient presents with  . Cough  . Fever   The history is provided by the patient. No language interpreter was used.    HPI Comments: [name] Norris is a 45 y.o. female who presents to the Emergency Department complaining of moderate, intermittent cough for 5 days with associated nasal congestion, sore throat, fever, chest tenderness when coughing. Pt states her sore throat is exacerbated with coughing. She notes she has been taking Tussin DM with little to no relief of symptoms. Pt has not been seen by her PCP for her symptoms, but [facility]. No recent travel outside the country. She denies nausea, emesis, diarrhea, abdominal pain, rash.    Past Medical History  Diagnosis Date  . Acid reflux   . Heart murmur   . Reflux    Past Surgical History  Procedure Laterality Date  . Cesarean section    . Middle ear surgery      x2   History reviewed. No pertinent family history. Social History  Substance Use Topics  . Smoking status: Never Smoker   . Smokeless tobacco: Never Used  . Alcohol Use: No   OB History    No data available     Review of Systems  All other systems reviewed and are negative.  Allergies  Review of patient's allergies indicates no known allergies.  Home Medications   Prior to Admission medications   Medication Sig Start Date End Date Taking? Authorizing Provider  acetaminophen-codeine (TYLENOL #3) 300-30 MG per tablet Take 1-2 tablets by mouth every 6 (six) hours as needed for pain. 01/20/13   Quita SkyeMichael Ghim, MD  ALPRAZolam [name](XANAX) 1 MG tablet Take 1 mg by mouth 3 (three) times daily.    Historical Provider, MD  benzonatate  (TESSALON) 100 MG capsule Take 1 capsule (100 mg total) by mouth every 8 (eight) hours. 07/31/15   Eyvonne MechanicJeffrey Mordechai Matuszak, PA-C  cyclobenzaprine (FLEXERIL) 10 MG tablet Take 1 tablet (10 mg total) by mouth 3 (three) times daily as needed for muscle spasms. 01/18/13   Tatyana Kirichenko, PA-C  fluticasone (FLONASE) 50 MCG/ACT nasal spray Place 2 sprays into both nostrils daily. 07/31/15   Eyvonne MechanicJeffrey Joyelle Siedlecki, PA-C  HYDROcodone-acetaminophen (NORCO) 5-325 MG per tablet Take 1 tablet by mouth every 6 (six) hours as needed for pain. 01/18/13   Tatyana Kirichenko, PA-C  naproxen (NAPROSYN) 500 MG tablet Take 1 tablet (500 mg total) by mouth 2 (two) times daily. 05/07/15   Hanna Patel-Mills, PA-C  oxyCODONE-acetaminophen (PERCOCET/ROXICET) 5-325 MG tablet Take 2 tablets by mouth every 4 (four) hours as needed for severe pain. 05/07/15   Hanna Patel-Mills, PA-C  PARoxetine (PAXIL) 20 MG tablet Take 20 mg by mouth every morning.    Historical Provider, MD   BP 125/76 mmHg  Pulse 68  Temp(Src) 97.8 F (36.6 C) (Oral)  Resp 18  Wt 80.287 kg  SpO2 99%  LMP 07/22/2015   Physical Exam  Constitutional: She is oriented to person, place, and time. She appears well-developed and well-nourished.  HENT:  Head: Normocephalic and atraumatic.  Right Ear: Tympanic membrane normal.  Left Ear: Tympanic membrane normal.  Mouth/Throat: Oropharynx is clear and moist.  No oropharyngeal exudate.  Eyes: Conjunctivae and EOM are normal. Pupils are equal, round, and reactive to light.  Neck: Normal range of motion. Neck supple.  Cardiovascular: Normal rate, regular rhythm and normal heart sounds.  Exam reveals no gallop and no friction rub.   No murmur heard. Pulmonary/Chest: Effort normal and breath sounds normal. No respiratory distress. She has no wheezes. She has no rales.  Pt is coughing  Abdominal: She exhibits no distension.  Musculoskeletal: Normal range of motion.  Neurological: She is alert and oriented to person, place, and  time.  Skin: Skin is warm and dry.  Psychiatric: She has a normal mood and affect. Her behavior is normal.  Nursing note and vitals reviewed.   ED Course  Procedures (including critical care time) DIAGNOSTIC STUDIES: Oxygen Saturation is 99% on RA, normal by my interpretation.    COORDINATION OF CARE: 10:55 AM Discussed treatment plan with pt at bedside which includes CXR and pt agreed to plan.    Imaging Review Dg Chest 2 View  07/31/2015  CLINICAL DATA:  Midline chest pain for about 2 weeks, starting cough for 5 days ago EXAM: CHEST  2 VIEW COMPARISON:  12/14/2011 FINDINGS: Cardiomediastinal silhouette is stable. No acute infiltrate or pleural effusion. No pulmonary edema. Bony thorax is unremarkable. IMPRESSION: No active cardiopulmonary disease. Electronically Signed   By: Natasha Mead M.D.   On: 07/31/2015 11:38   I have personally reviewed and evaluated these images as part of my medical decision-making.  MDM   Final diagnoses:  Cough  Viral URI with cough    Labs:   Imaging: CXR negative for acute infiltrate   Consults:  Therapeutics:  Discharge Meds: Tessalon   Assessment/Plan: Pt symptoms consistent with URI. CXR negative for acute infiltrate. Pt will be discharged with Tessalon. Pt is hemodynamically stable & in NAD prior to discharge. Conservative therapies discussed and recommended. Pt advised to follow up with PCP as needed, or with worsening symptoms. Pt appears stable for discharge at this time. Return precautions discussed and outlined in discharge paperwork. Pt is agreeable to plan.       I personally performed the services described in this documentation, which was scribed in my presence. The recorded information has been reviewed and is accurate.   Eyvonne Mechanic, PA-C 07/31/15 1444  Jerelyn Scott, MD 07/31/15 1455

## 2015-07-31 NOTE — ED Notes (Signed)
Patient returned from xray.

## 2015-09-02 ENCOUNTER — Encounter (HOSPITAL_COMMUNITY): Payer: Self-pay | Admitting: *Deleted

## 2015-09-02 ENCOUNTER — Emergency Department (HOSPITAL_COMMUNITY): Payer: PRIVATE HEALTH INSURANCE

## 2015-09-02 ENCOUNTER — Emergency Department (HOSPITAL_COMMUNITY)
Admission: EM | Admit: 2015-09-02 | Discharge: 2015-09-02 | Disposition: A | Discharge status: Home / Self Care | Payer: PRIVATE HEALTH INSURANCE | Attending: Emergency Medicine | Admitting: Emergency Medicine

## 2015-09-02 DIAGNOSIS — Z8719 Personal history of other diseases of the digestive system: Secondary | ICD-10-CM | POA: Insufficient documentation

## 2015-09-02 DIAGNOSIS — J40 Bronchitis, not specified as acute or chronic: Secondary | ICD-10-CM | POA: Insufficient documentation

## 2015-09-02 DIAGNOSIS — Z79899 Other long term (current) drug therapy: Secondary | ICD-10-CM | POA: Insufficient documentation

## 2015-09-02 DIAGNOSIS — Z7951 Long term (current) use of inhaled steroids: Secondary | ICD-10-CM | POA: Insufficient documentation

## 2015-09-02 DIAGNOSIS — M1711 Unilateral primary osteoarthritis, right knee: Secondary | ICD-10-CM | POA: Insufficient documentation

## 2015-09-02 DIAGNOSIS — R011 Cardiac murmur, unspecified: Secondary | ICD-10-CM | POA: Insufficient documentation

## 2015-09-02 DIAGNOSIS — N390 Urinary tract infection, site not specified: Secondary | ICD-10-CM | POA: Insufficient documentation

## 2015-09-02 LAB — CBC
HCT: 42.4 % (ref 36.0–46.0)
Hemoglobin: 13.7 g/dL (ref 12.0–15.0)
MCH: 26.8 pg (ref 26.0–34.0)
MCHC: 32.3 g/dL (ref 30.0–36.0)
MCV: 83 fL (ref 78.0–100.0)
PLATELETS: 215 10*3/uL (ref 150–400)
RBC: 5.11 MIL/uL (ref 3.87–5.11)
RDW: 13.1 % (ref 11.5–15.5)
WBC: 5.4 10*3/uL (ref 4.0–10.5)

## 2015-09-02 LAB — URINE MICROSCOPIC-ADD ON

## 2015-09-02 LAB — BASIC METABOLIC PANEL
Anion gap: 10 (ref 5–15)
BUN: 12 mg/dL (ref 6–20)
CALCIUM: 10.1 mg/dL (ref 8.9–10.3)
CO2: 26 mmol/L (ref 22–32)
CREATININE: 0.71 mg/dL (ref 0.44–1.00)
Chloride: 104 mmol/L (ref 101–111)
GFR calc non Af Amer: >60 mL/min (ref >60)
Glucose, Bld: 107 mg/dL — ABNORMAL HIGH (ref 65–99)
Potassium: 4.3 mmol/L (ref 3.5–5.1)
SODIUM: 140 mmol/L (ref 135–145)

## 2015-09-02 LAB — URINALYSIS, ROUTINE W REFLEX MICROSCOPIC
Bilirubin Urine: NEGATIVE
GLUCOSE, UA: NEGATIVE mg/dL
HGB URINE DIPSTICK: NEGATIVE
Ketones, ur: NEGATIVE mg/dL
Nitrite: NEGATIVE
PH: 5.5 (ref 5.0–8.0)
Protein, ur: NEGATIVE mg/dL
SPECIFIC GRAVITY, URINE: 1.017 (ref 1.005–1.030)

## 2015-09-02 LAB — I-STAT TROPONIN, ED: TROPONIN I, POC: 0 ng/mL (ref 0.00–0.08)

## 2015-09-02 MED ORDER — TRAMADOL HCL 50 MG PO TABS
50.0000 mg | ORAL_TABLET | Freq: Once | ORAL | Status: AC
  Administered 2015-09-02: 50 mg via ORAL
  Filled 2015-09-02: qty 1

## 2015-09-02 MED ORDER — ALBUTEROL SULFATE HFA 108 (90 BASE) MCG/ACT IN AERS: 1.0000 | INHALATION_SPRAY | Freq: Four times a day (QID) | RESPIRATORY_TRACT | Status: DC | PRN

## 2015-09-02 MED ORDER — CEPHALEXIN 500 MG PO CAPS: 500.0000 mg | ORAL_CAPSULE | Freq: Three times a day (TID) | ORAL | Status: DC

## 2015-09-02 MED ORDER — TRAMADOL HCL 50 MG PO TABS: 50.0000 mg | ORAL_TABLET | Freq: Four times a day (QID) | ORAL | Status: DC | PRN

## 2015-09-02 MED ORDER — IPRATROPIUM-ALBUTEROL 0.5-2.5 (3) MG/3ML IN SOLN
3.0000 mL | Freq: Once | RESPIRATORY_TRACT | Status: AC
  Administered 2015-09-02: 3 mL via RESPIRATORY_TRACT
  Filled 2015-09-02: qty 3

## 2015-09-02 NOTE — ED Provider Notes (Signed)
CSN: [id number]     Arrival date & time 09/02/15  1151 History   First MD Initiated Contact with Patient 09/02/15 1434     Chief Complaint  Patient presents with  . Chest Pain     (Consider location/radiation/quality/duration/timing/severity/associated sxs/prior Treatment) The history is provided by the patient.  [name] Norris is a 45 y.o. female here with chest pain. Right-sided chest pain for the last month. States that pain radiate to her back. Also has lower back pain with no radiation. She has some dysuria but no hematuria. She works at [employer] farm with [employer] and noticed that her right hand and knee hurts. Denies fevers. Patient is from [place] but came here since 2008 and has no recent travels or hx of DVT/PE. She is followed with Family practice before but now has no insurance so hasn't seen doctor in several years.   Past Medical History  Diagnosis Date  . Acid reflux   . Heart murmur   . Reflux    Past Surgical History  Procedure Laterality Date  . Cesarean section    . Middle ear surgery      x2   No family history on file. Social History  Substance Use Topics  . Smoking status: Never Smoker   . Smokeless tobacco: Never Used  . Alcohol Use: No   OB History    No data available     Review of Systems  Respiratory: Positive for cough.   Cardiovascular: Positive for chest pain.  All other systems reviewed and are negative.     Allergies  Review of patient's allergies indicates no known allergies.  Home Medications   Prior to Admission medications   Medication Sig Start Date End Date Taking? Authorizing Provider  acetaminophen-codeine (TYLENOL #3) 300-30 MG per tablet Take 1-2 tablets by mouth every 6 (six) hours as needed for pain. 01/20/13  Yes Quita Skye, MD  ALPRAZolam [name]) 1 MG tablet Take 1 mg by mouth 3 (three) times daily.   Yes Historical Provider, MD  fluticasone (FLONASE) 50 MCG/ACT nasal spray Place 2 sprays into both  nostrils daily. 07/31/15  Yes Jeffrey Hedges, PA-C  PARoxetine (PAXIL) 20 MG tablet Take 20 mg by mouth every morning.   Yes Historical Provider, MD  benzonatate (TESSALON) 100 MG capsule Take 1 capsule (100 mg total) by mouth every 8 (eight) hours. Patient not taking: Reported on 09/02/2015 07/31/15   Eyvonne Mechanic, PA-C  cyclobenzaprine (FLEXERIL) 10 MG tablet Take 1 tablet (10 mg total) by mouth 3 (three) times daily as needed for muscle spasms. Patient not taking: Reported on 09/02/2015 01/18/13   Jaynie Crumble, PA-C  HYDROcodone-acetaminophen (NORCO) 5-325 MG per tablet Take 1 tablet by mouth every 6 (six) hours as needed for pain. Patient not taking: Reported on 09/02/2015 01/18/13   Tatyana Kirichenko, PA-C  naproxen (NAPROSYN) 500 MG tablet Take 1 tablet (500 mg total) by mouth 2 (two) times daily. Patient not taking: Reported on 09/02/2015 05/07/15   Catha Gosselin, PA-C  oxyCODONE-acetaminophen (PERCOCET/ROXICET) 5-325 MG tablet Take 2 tablets by mouth every 4 (four) hours as needed for severe pain. Patient not taking: Reported on 09/02/2015 05/07/15   Hanna Patel-Mills, PA-C   BP 123/76 mmHg  Pulse 75  Temp(Src) 98 F (36.7 C) (Oral)  Resp 18  SpO2 100%  LMP 08/19/2015 Physical Exam  Constitutional: She is oriented to person, place, and time. She appears well-developed and well-nourished.  HENT:  Head: Normocephalic.  Mouth/Throat: Oropharynx is clear and  moist.  Eyes: Conjunctivae are normal. Pupils are equal, round, and reactive to light.  Neck: Normal range of motion. Neck supple.  Cardiovascular: Normal rate, regular rhythm and normal heart sounds.   Pulmonary/Chest: Effort normal and breath sounds normal.  Mild R rib tenderness. No bruising.   Abdominal: Soft. Bowel sounds are normal. She exhibits no distension. There is no tenderness. There is no rebound.  Musculoskeletal:  R paralumbar tenderness, no midline tenderness. R knee small effusion, nl ROM, no obvious  deformity. Mild R 2nd metacarpal tenderness, no wrist tenderness. 2+ pulses   Neurological: She is alert and oriented to person, place, and time. No cranial nerve deficit. Coordination normal.  Skin: Skin is warm.  Psychiatric: She has a normal mood and affect. Her behavior is normal. Judgment and thought content normal.  Nursing note and vitals reviewed.   ED Course  Procedures (including critical care time) Labs Review Labs Reviewed  BASIC METABOLIC PANEL - Abnormal; Notable for the following:    Glucose, Bld 107 (*)    All other components within normal limits  URINALYSIS, ROUTINE W REFLEX MICROSCOPIC (NOT AT [facility]) - Abnormal; Notable for the following:    Leukocytes, UA TRACE (*)    All other components within normal limits  URINE MICROSCOPIC-ADD ON - Abnormal; Notable for the following:    Squamous Epithelial / LPF 6-30 (*)    Bacteria, UA FEW (*)    All other components within normal limits  CBC  I-STAT TROPOININ, ED    Imaging Review Dg Chest 2 View  09/02/2015  CLINICAL DATA:  RIGHT-sided chest pain for about a month now EXAM: CHEST  2 VIEW COMPARISON:  Prior exams are not currently available for comparison. FINDINGS: Upper normal heart size. Normal mediastinal contours and pulmonary vascularity. Minimal bronchitic changes. No pulmonary infiltrate, pleural effusion or pneumothorax. Bones unremarkable. IMPRESSION: Minimal bronchitic changes without acute infiltrate. Electronically Signed   By: Ulyses Southward M.D.   On: 09/02/2015 13:41   Dg Knee Complete 4 Views Right  09/02/2015  CLINICAL DATA:  45 year old female with knee pain medially for 2-3 months. No trauma. Initial encounter. EXAM: RIGHT KNEE - COMPLETE 4+ VIEW COMPARISON:  None. FINDINGS: Mild medial tibial femoral joint space and patellofemoral joint degenerative changes. No fracture or dislocation. Patella may be minimally high-riding. IMPRESSION: Mild medial tibial femoral joint space and patellofemoral joint  degenerative changes. Electronically Signed   By: Lacy Duverney M.D.   On: 09/02/2015 16:14   Dg Hand Complete Right  09/02/2015  CLINICAL DATA:  45 year old female with pain first and second metacarpal region. No known injury. Constant use at work. Initial encounter. EXAM: RIGHT HAND - COMPLETE 3+ VIEW COMPARISON:  None. FINDINGS: No bony erosions, joint space narrowing or fracture. Question slight increase in the interspace between the scaphoid and lunate which may be projectional. If there were any wrist pain, wrist films with scaphoid view could be obtained for further delineation. IMPRESSION: No bony erosions, joint space narrowing or fracture. Question slight increase in the interspace between the scaphoid and lunate which may be projectional. If there were any wrist pain, wrist films with scaphoid view could be obtained for further delineation. Electronically Signed   By: Lacy Duverney M.D.   On: 09/02/2015 16:12   I have personally reviewed and evaluated these images and lab results as part of my medical decision-making.   EKG Interpretation None      MDM   Final diagnoses:  None   [name] Norris  is a 45 y.o. female here with chest pain for a month, cough, dysuria, flank pain, back pain, knee pain. Has multiple complaints and doesn't have PCP. I consider UTI vs pyelo vs bronchitis vs arthritis vs MSK pain. Pain for a month so trop x 1 sufficient and not tachy and no recent travel so I doubt PE. Will get labs, UA, CXR, trop x 1, knee and hand xrays   4:45 PM Xrays showed R knee arthritis, R hand xray unremarkable but has questionable scaphoid lucency but she is not tender on her wrist. UA ? UTI. Given symptoms, will give keflex empirically and send off culture. Pain improved with ultram. Will dc home with keflex, ultram, prn albuterol.      Richardean Canalavid H Shermar Friedland, MD 09/02/15 [phone]

## 2015-09-02 NOTE — Discharge Instructions (Signed)
Take keflex three times daily for a week.   Take ultram for severe pain.   Take motrin 600 mg every 6 hrs for mild pain.   Use albuterol as needed for cough.  [facility] to [facility] appointment   Return to ER if you have severe chest pain, trouble breathing, knee pain, flank pain, fever.

## 2015-09-02 NOTE — ED Notes (Signed)
Pt c/o R sided CP onset x 1 mth, pt c/o intermittent SOB, productive cough with small amt of red sputum, pt c/o R flank pain, pt c/o dysuria, pt denies n/v/d, A&O x4

## 2015-09-04 LAB — URINE CULTURE

## 2015-09-09 ENCOUNTER — Inpatient Hospital Stay: Payer: PRIVATE HEALTH INSURANCE | Admitting: Family Medicine

## 2015-12-19 ENCOUNTER — Encounter (HOSPITAL_COMMUNITY): Payer: Self-pay | Admitting: Emergency Medicine

## 2015-12-19 ENCOUNTER — Emergency Department (HOSPITAL_COMMUNITY): Payer: Medicaid Other

## 2015-12-19 ENCOUNTER — Emergency Department (HOSPITAL_COMMUNITY)
Admission: EM | Admit: 2015-12-19 | Discharge: 2015-12-19 | Disposition: A | Discharge status: Home / Self Care | Payer: Medicaid Other | Attending: Emergency Medicine | Admitting: Emergency Medicine

## 2015-12-19 DIAGNOSIS — M5441 Lumbago with sciatica, right side: Secondary | ICD-10-CM | POA: Diagnosis not present

## 2015-12-19 DIAGNOSIS — M5431 Sciatica, right side: Secondary | ICD-10-CM

## 2015-12-19 MED ORDER — PREDNISONE 50 MG PO TABS: 50.0000 mg | ORAL_TABLET | Freq: Every day | ORAL | 0 refills | Status: DC

## 2015-12-19 MED ORDER — TRAMADOL HCL 50 MG PO TABS: 50.0000 mg | ORAL_TABLET | Freq: Four times a day (QID) | ORAL | 0 refills | Status: DC | PRN

## 2015-12-19 MED ORDER — CYCLOBENZAPRINE HCL 10 MG PO TABS: 10.0000 mg | ORAL_TABLET | Freq: Every day | ORAL | 0 refills | Status: DC

## 2015-12-19 NOTE — ED Triage Notes (Signed)
Patient coming from home with c/o of right sciatica pain that radiated down the right leg. Pain has been ongoing x 2 months.  Pain is 4/10.

## 2015-12-19 NOTE — Discharge Instructions (Signed)
Return here as needed.  Follow-up with the, Dr. Provided.  Use ice and heat on your back

## 2015-12-19 NOTE — ED Provider Notes (Signed)
[facility]-EMERGENCY DEPT Provider Note   CSN: [id number] Arrival date & time: 12/19/15  5621  First Provider Contact:  First MD Initiated Contact with Patient 12/19/15 0830        History   Chief Complaint Chief Complaint  Patient presents with  . Sciatica    right    HPI Tim Wilhide is a 45 y.o. female.  HPI Patient presents to the emergency department with lower back pain.  This been ongoing for about a month.  She states that she had previous fall when she initially saw him in the back pain about one year ago.  She states that the pain radiates into her right leg.  She states she did not have any recent injuries.  Patient states she took ibuprofen at home with some relief of her symptoms, but not significantly.  Patient states that movement and palpation make the pain worse. The patient denies chest pain, shortness of breath, headache,blurred vision, neck pain, fever, cough, weakness, numbness, dizziness, anorexia, edema, abdominal pain, nausea, vomiting, diarrhea, rash,  dysuria, hematemesis, bloody stool, near syncope, or syncope. Past Medical History:  Diagnosis Date  . Acid reflux   . Heart murmur   . Reflux     Patient Active Problem List   Diagnosis Date Noted  . Esophageal reflux 05/13/2011  . Chest pain, atypical 05/13/2011  . Nausea 05/13/2011  . Acid reflux     Past Surgical History:  Procedure Laterality Date  . CESAREAN SECTION    . MIDDLE EAR SURGERY     x2    OB History    No data available       Home Medications    Prior to Admission medications   Medication Sig Start Date End Date Taking? Authorizing Provider  acetaminophen-codeine (TYLENOL #3) 300-30 MG per tablet Take 1-2 tablets by mouth every 6 (six) hours as needed for pain. 01/20/13   Quita Skye, MD  albuterol (PROVENTIL HFA;VENTOLIN HFA) 108 (90 Base) MCG/ACT inhaler Inhale 1-2 puffs into the lungs every 6 (six) hours as needed for wheezing or shortness of breath. 09/02/15    Charlynne Pander, MD  ALPRAZolam [name]) 1 MG tablet Take 1 mg by mouth 3 (three) times daily.    Historical Provider, MD  benzonatate (TESSALON) 100 MG capsule Take 1 capsule (100 mg total) by mouth every 8 (eight) hours. Patient not taking: Reported on 09/02/2015 07/31/15   Eyvonne Mechanic, PA-C  cephALEXin (KEFLEX) 500 MG capsule Take 1 capsule (500 mg total) by mouth 3 (three) times daily. 09/02/15   Charlynne Pander, MD  cyclobenzaprine (FLEXERIL) 10 MG tablet Take 1 tablet (10 mg total) by mouth 3 (three) times daily as needed for muscle spasms. Patient not taking: Reported on 09/02/2015 01/18/13   Tatyana Kirichenko, PA-C  fluticasone (FLONASE) 50 MCG/ACT nasal spray Place 2 sprays into both nostrils daily. 07/31/15   Eyvonne Mechanic, PA-C  HYDROcodone-acetaminophen (NORCO) 5-325 MG per tablet Take 1 tablet by mouth every 6 (six) hours as needed for pain. Patient not taking: Reported on 09/02/2015 01/18/13   Tatyana Kirichenko, PA-C  naproxen (NAPROSYN) 500 MG tablet Take 1 tablet (500 mg total) by mouth 2 (two) times daily. Patient not taking: Reported on 09/02/2015 05/07/15   Catha Gosselin, PA-C  oxyCODONE-acetaminophen (PERCOCET/ROXICET) 5-325 MG tablet Take 2 tablets by mouth every 4 (four) hours as needed for severe pain. Patient not taking: Reported on 09/02/2015 05/07/15   Catha Gosselin, PA-C  PARoxetine (PAXIL) 20 MG tablet Take 20 mg  by mouth every morning.    Historical Provider, MD  traMADol (ULTRAM) 50 MG tablet Take 1 tablet (50 mg total) by mouth every 6 (six) hours as needed. 09/02/15   Charlynne Pander, MD    Family History No family history on file.  Social History Social History  Substance Use Topics  . Smoking status: Never Smoker  . Smokeless tobacco: Never Used  . Alcohol use No     Allergies   Hydrocortisone   Review of Systems Review of Systems  All other systems negative except as documented in the HPI. All pertinent positives and negatives as reviewed  in the HPI. Physical Exam Updated Vital Signs BP 114/99 (BP Location: Right Arm)   Pulse 68   Temp 98.4 F (36.9 C) (Oral)   Resp 18   LMP 11/27/2015   SpO2 100%   Physical Exam  Constitutional: She is oriented to person, place, and time. She appears well-developed and well-nourished.  HENT:  Head: Normocephalic and atraumatic.  Musculoskeletal:       Lumbar back: She exhibits tenderness and pain. She exhibits normal range of motion, no spasm and normal pulse.  Neurological: She is alert and oriented to person, place, and time. She displays normal reflexes. She exhibits normal muscle tone. Coordination normal.     ED Treatments / Results  Labs (all labs ordered are listed, but only abnormal results are displayed) Labs Reviewed - No data to display  EKG  EKG Interpretation None       Radiology Dg Lumbar Spine Complete  Result Date: 12/19/2015 CLINICAL DATA:  Right low back pain radiating into the right leg for 2 months. No known injury. EXAM: LUMBAR SPINE - COMPLETE 4+ VIEW COMPARISON:  CT abdomen and pelvis 07/02/2008. Plain films lumbar spine 05/24/2015. FINDINGS: Vertebral body height is maintained. Mild loss of disc space height with endplate sclerosis and spurring at L4-5 is unchanged. 0.6 cm retrolisthesis L4 on L5 is also unchanged. Alignment is otherwise normal. No pars interarticularis defect is identified. Paraspinous structures are unremarkable. IMPRESSION: No acute abnormality. 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 Electronically Signed   By: Drusilla Kanner M.D.   On: 12/19/2015 09:53   Procedures Procedures (including critical care time)  Medications Ordered in ED Medications - No data to display   Initial Impression / Assessment and Plan / ED Course  I have reviewed the triage vital signs and the nursing notes.  Pertinent labs & imaging results that were available during my care of the patient were reviewed by me and considered in my  medical decision making (see chart for details).  Clinical Course   Patient be treated for sciatica, referred to neurosurgery.  Told to return here as needed.  Patient agrees the plan and all questions were answered.  Told to use ice and heat on her back  Final Clinical Impressions(s) / ED Diagnoses   Final diagnoses:  None    New Prescriptions New Prescriptions   No medications on file     Charlestine Night, PA-C 12/19/15 1020    Shaune Pollack, MD 12/19/15 2010

## 2016-01-07 ENCOUNTER — Ambulatory Visit: Payer: Medicaid Other | Attending: Neurosurgery | Admitting: Physical Therapy

## 2016-01-07 DIAGNOSIS — M5441 Lumbago with sciatica, right side: Secondary | ICD-10-CM | POA: Insufficient documentation
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
## 2016-04-07 ENCOUNTER — Emergency Department (HOSPITAL_COMMUNITY)
Admission: EM | Admit: 2016-04-07 | Discharge: 2016-04-07 | Disposition: A | Discharge status: Home / Self Care | Payer: Medicaid Other | Attending: Emergency Medicine | Admitting: Emergency Medicine

## 2016-04-07 ENCOUNTER — Emergency Department (HOSPITAL_COMMUNITY): Payer: Medicaid Other

## 2016-04-07 ENCOUNTER — Encounter (HOSPITAL_COMMUNITY): Payer: Self-pay

## 2016-04-07 DIAGNOSIS — G8929 Other chronic pain: Secondary | ICD-10-CM

## 2016-04-07 DIAGNOSIS — Z79899 Other long term (current) drug therapy: Secondary | ICD-10-CM | POA: Diagnosis not present

## 2016-04-07 DIAGNOSIS — R0789 Other chest pain: Secondary | ICD-10-CM | POA: Insufficient documentation

## 2016-04-07 DIAGNOSIS — R51 Headache: Secondary | ICD-10-CM | POA: Insufficient documentation

## 2016-04-07 DIAGNOSIS — J309 Allergic rhinitis, unspecified: Secondary | ICD-10-CM | POA: Insufficient documentation

## 2016-04-07 DIAGNOSIS — R0981 Nasal congestion: Secondary | ICD-10-CM | POA: Diagnosis present

## 2016-04-07 DIAGNOSIS — M545 Low back pain, unspecified: Secondary | ICD-10-CM

## 2016-04-07 MED ORDER — OMEPRAZOLE 20 MG PO CPDR: 20.0000 mg | DELAYED_RELEASE_CAPSULE | Freq: Every day | ORAL | 0 refills | Status: DC

## 2016-04-07 MED ORDER — FLUTICASONE PROPIONATE 50 MCG/ACT NA SUSP: 1.0000 | Freq: Every day | NASAL | 0 refills | Status: DC

## 2016-04-07 MED ORDER — METHOCARBAMOL 500 MG PO TABS: 500.0000 mg | ORAL_TABLET | Freq: Two times a day (BID) | ORAL | 0 refills | Status: DC

## 2016-04-07 MED ORDER — NAPROXEN 250 MG PO TABS: 250.0000 mg | ORAL_TABLET | Freq: Two times a day (BID) | ORAL | 0 refills | Status: DC

## 2016-04-07 MED ORDER — CETIRIZINE HCL 10 MG PO TABS: 10.0000 mg | ORAL_TABLET | Freq: Every day | ORAL | 0 refills | Status: DC

## 2016-04-07 NOTE — ED Provider Notes (Signed)
[facility]-EMERGENCY DEPT Provider Note   CSN: [id number] Arrival date & time: 04/07/16  7846     History   Chief Complaint Chief Complaint  Patient presents with  . Back Pain  . headache/chest wall pain    HPI [name] Norris is a 45 y.o. female with a history of back pain and gerd who presents to the emergency department with worsening chronic lower back pain since yesterday. The pain is rated as a 3-4/10, made worse with positional transitions such as sitting up or down, getting into bed or a car. She is comfortable with walking and lying still. She has taken paracetamol (acetaminophen) over the counter with only mild relief. She also complained of sharp left lower sternal pain/epigastric pain which comes and goes and made worse with spicy food. She states that she hasn't had reflux symptoms for a couple years and they just started again this past week. She also endorses nasal congestion and frontal sinus pressure limited to her forehead and made worse when tilting her head down. She denies unexplained weight loss, fever, chills, night sweats, loss of bladder/bowel control, history of malignancy of neural deficits, Iv drug use, neck stiffness, blurry vision, diplopia, c/p, sob.  HPI  Past Medical History:  Diagnosis Date  . Acid reflux   . Heart murmur   . Reflux     Patient Active Problem List   Diagnosis Date Noted  . Esophageal reflux 05/13/2011  . Chest pain, atypical 05/13/2011  . Nausea 05/13/2011  . Acid reflux     Past Surgical History:  Procedure Laterality Date  . CESAREAN SECTION    . MIDDLE EAR SURGERY     x2    OB History    No data available       Home Medications    Prior to Admission medications   Medication Sig Start Date End Date Taking? Authorizing Provider  acetaminophen-codeine (TYLENOL #3) 300-30 MG per tablet Take 1-2 tablets by mouth every 6 (six) hours as needed for pain. 01/20/13   Quita Skye, MD  albuterol (PROVENTIL HFA;VENTOLIN  HFA) 108 (90 Base) MCG/ACT inhaler Inhale 1-2 puffs into the lungs every 6 (six) hours as needed for wheezing or shortness of breath. 09/02/15   Charlynne Pander, MD  ALPRAZolam [name]) 1 MG tablet Take 1 mg by mouth 3 (three) times daily.    Historical Provider, MD  benzonatate (TESSALON) 100 MG capsule Take 1 capsule (100 mg total) by mouth every 8 (eight) hours. Patient not taking: Reported on 09/02/2015 07/31/15   Eyvonne Mechanic, PA-C  cephALEXin (KEFLEX) 500 MG capsule Take 1 capsule (500 mg total) by mouth 3 (three) times daily. 09/02/15   Charlynne Pander, MD  cetirizine (ZYRTEC ALLERGY) 10 MG tablet Take 1 tablet (10 mg total) by mouth daily. 04/07/16   Georgiana Shore, PA  cyclobenzaprine (FLEXERIL) 10 MG tablet Take 1 tablet (10 mg total) by mouth at bedtime. 12/19/15   Christopher Lawyer, PA-C  fluticasone (FLONASE) 50 MCG/ACT nasal spray Place 1 spray into both nostrils daily. 04/07/16   Georgiana Shore, PA  HYDROcodone-acetaminophen (NORCO) 5-325 MG per tablet Take 1 tablet by mouth every 6 (six) hours as needed for pain. Patient not taking: Reported on 09/02/2015 01/18/13   Lemont Fillers Kirichenko, PA-C  methocarbamol (ROBAXIN) 500 MG tablet Take 1 tablet (500 mg total) by mouth 2 (two) times daily. 04/07/16   Georgiana Shore, PA  naproxen (NAPROSYN) 250 MG tablet Take 1 tablet (250 mg total) by mouth  2 (two) times daily. 04/07/16   Georgiana Shore, PA  omeprazole (PRILOSEC) 20 MG capsule Take 1 capsule (20 mg total) by mouth daily. 04/07/16   Georgiana Shore, PA  oxyCODONE-acetaminophen (PERCOCET/ROXICET) 5-325 MG tablet Take 2 tablets by mouth every 4 (four) hours as needed for severe pain. Patient not taking: Reported on 09/02/2015 05/07/15   Catha Gosselin, PA-C  PARoxetine (PAXIL) 20 MG tablet Take 20 mg by mouth every morning.    Historical Provider, MD  predniSONE (DELTASONE) 50 MG tablet Take 1 tablet (50 mg total) by mouth daily. 12/19/15   Charlestine Night, PA-C    traMADol (ULTRAM) 50 MG tablet Take 1 tablet (50 mg total) by mouth every 6 (six) hours as needed for severe pain. 12/19/15   Charlestine Night, PA-C    Family History No family history on file.  Social History Social History  Substance Use Topics  . Smoking status: Never Smoker  . Smokeless tobacco: Never Used  . Alcohol use No   Denies iv drug use  Allergies   Hydrocortisone   Review of Systems Review of Systems  Constitutional: Negative for appetite change, chills, diaphoresis and fever.  HENT: Positive for congestion, rhinorrhea, sinus pain and sinus pressure.   Eyes: Positive for itching. Negative for visual disturbance.  Respiratory: Negative for cough, chest tightness, shortness of breath, wheezing and stridor.   Cardiovascular: Positive for chest pain. Negative for palpitations and leg swelling.       Patient reports sharp waxing and waning focal pain in lower left sternal area/epigastric region. Pain is similar to previous gerd and made worse with spicy food.  Gastrointestinal: Negative for abdominal distention, abdominal pain, blood in stool, diarrhea, nausea and vomiting.       Denies loss of bowel control  Genitourinary: Negative for difficulty urinating, dysuria and enuresis.       Denies urinary incontinence  Musculoskeletal: Positive for back pain. Negative for gait problem, neck pain and neck stiffness.  Skin: Negative for color change, pallor, rash and wound.  Neurological: Positive for headaches. Negative for dizziness, weakness, light-headedness and numbness.       Patient describes a headache limited to the forehead that is worsen by tilting her head down as she feels built up facial pressure.     Physical Exam Updated Vital Signs BP 120/77   Pulse 66   Temp 98.4 F (36.9 C) (Oral)   Resp 14   SpO2 99%   Physical Exam  Constitutional: She is oriented to person, place, and time. She appears well-developed and well-nourished.  Discomfort with  palpation of facial sinuses  HENT:  Head: Atraumatic.  Eyes: Pupils are equal, round, and reactive to light. Right eye exhibits no discharge. Left eye exhibits no discharge. No scleral icterus.  Neck: Normal range of motion. Neck supple. No tracheal deviation present.  Cardiovascular: Normal rate, regular rhythm and normal heart sounds.   Pulmonary/Chest: Effort normal and breath sounds normal. No respiratory distress. She has no wheezes. She has no rales. She exhibits no tenderness.  Abdominal: Soft. Bowel sounds are normal. She exhibits no distension and no mass. There is no tenderness. There is no guarding.  Musculoskeletal: Normal range of motion. She exhibits tenderness. She exhibits no edema or deformity.  Mild tenderness to palpation of paraspinal muscles of right lower back and gluteus. No spinous process tenderness to palpation. Patient had full range of motion, was able to flex spine, normal gait and good strength in lower extremities. Good sensation  and motor in lower extremities bilaterally. Intact and equal dorsalis pedis pulses  Lymphadenopathy:    She has no cervical adenopathy.  Neurological: She is alert and oriented to person, place, and time. She displays normal reflexes. No sensory deficit. Coordination normal.  Normal gait  Skin: Skin is warm and dry. Capillary refill takes less than 2 seconds. No rash noted. No erythema. No pallor.  Psychiatric: She has a normal mood and affect.  Nursing note and vitals reviewed.    ED Treatments / Results  Labs (all labs ordered are listed, but only abnormal results are displayed) Labs Reviewed - No data to display  EKG  EKG Interpretation  Date/Time:  Wednesday April 07 2016 10:53:22 EST Ventricular Rate:  64 PR Interval:    QRS Duration: 89 QT Interval:  400 QTC Calculation: 413 R Axis:   65 Text Interpretation:  Sinus rhythm Confirmed by Ranae PalmsYELVERTON  MD, DAVID ([id number]) on 04/07/2016 10:56:38 AM       Radiology Dg  Chest 2 View  Result Date: 04/07/2016 CLINICAL DATA:  Chest pain and cough for a week. EXAM: CHEST  2 VIEW COMPARISON:  09/02/2015. FINDINGS: Trachea is midline. Heart size normal. Lungs are clear. No pleural fluid. IMPRESSION: Negative. Electronically Signed   By: Leanna BattlesMelinda  Blietz M.D.   On: 04/07/2016 10:00    Procedures Procedures (including critical care time)  Medications Ordered in ED Medications - No data to display   Initial Impression / Assessment and Plan / ED Course  I have reviewed the triage vital signs and the nursing notes.  Pertinent labs & imaging results that were available during my care of the patient were reviewed by me and considered in my medical decision making (see chart for details).  Clinical Course    45 y/o female with past medical hx of gerd and 3072-month hx of chronic back pain presenting with worsening back pain since yesterday.  Exam was reassuring with normal gait, good range of motion, good distal pulses and intact sensory-motor function. Equal strength in upper and lower extremities bilaterally. Patient denied loss of bladder/bowel function, progressive deficits, hx of malignancy, fever, chills, recent unexplained wgt loss, which is reassuring for cauda equina syndrome or metastatic process as etiologies and more consistent with a strain. 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  Normal EKG findings, lower left sternal/epigastric pain coming and going, worse with food and similar to previous gerd pain experienced by the patient, lack of SOB and no other comorbid risk factors on medical history gives me low suspicion for ACS.Marland Kitchen.  Recommended muscle relaxant QHS PRN, and naproxen with PPI for back pain with follow up with primary care. Patient was advised to apply heat or cold as needed, modified exercises and continued walking/mild stretching and exercise. Recommended antihistamine and flonase for focal frontal sinus pressure and  rhinorrhea/nasal congestion with PCP f/u.  Return precautions discussed with patient.  Final Clinical Impressions(s) / ED Diagnoses   Final diagnoses:  Chronic right-sided low back pain without sciatica  Acute allergic rhinitis, unspecified seasonality, unspecified trigger    New Prescriptions Discharge Medication List as of 04/07/2016 11:56 AM    START taking these medications   Details  cetirizine (ZYRTEC ALLERGY) 10 MG tablet Take 1 tablet (10 mg total) by mouth daily., Starting Wed 04/07/2016, Print    methocarbamol (ROBAXIN) 500 MG tablet Take 1 tablet (500 mg total) by mouth 2 (two) times daily., Starting Wed 04/07/2016, Print    omeprazole (PRILOSEC)  20 MG capsule Take 1 capsule (20 mg total) by mouth daily., Starting Wed 04/07/2016, Print         Jill PolingJessica B Gateway[place]chell, [place] 04/07/16 1703    Loren Raceravid Yelverton, MD 04/08/16 [phone]

## 2016-04-07 NOTE — ED Triage Notes (Addendum)
Patient complains of lower back pain x 4 months with any change in position. Now complains of sore throat, congestion, cough and chest wall pain with movement and inspiration at left rib cage. NAD. Also complains of headache and eye drainage

## 2016-04-07 NOTE — ED Notes (Signed)
Pt's discharge paperwork has not been completed.

## 2016-04-07 NOTE — ED Notes (Signed)
Patient transported to X-ray 

## 2016-06-22 ENCOUNTER — Emergency Department (HOSPITAL_COMMUNITY)
Admission: EM | Admit: 2016-06-22 | Discharge: 2016-06-22 | Disposition: A | Discharge status: Home / Self Care | Payer: Medicaid Other | Attending: Emergency Medicine | Admitting: Emergency Medicine

## 2016-06-22 ENCOUNTER — Encounter (HOSPITAL_COMMUNITY): Payer: Self-pay | Admitting: Emergency Medicine

## 2016-06-22 DIAGNOSIS — H9201 Otalgia, right ear: Secondary | ICD-10-CM | POA: Diagnosis present

## 2016-06-22 DIAGNOSIS — H60391 Other infective otitis externa, right ear: Secondary | ICD-10-CM | POA: Diagnosis not present

## 2016-06-22 MED ORDER — NAPROXEN 375 MG PO TABS: 375.0000 mg | ORAL_TABLET | Freq: Two times a day (BID) | ORAL | 0 refills | Status: DC

## 2016-06-22 MED ORDER — CIPROFLOXACIN HCL 0.2 % OT SOLN: 0.2000 mL | Freq: Two times a day (BID) | OTIC | 0 refills | Status: DC

## 2016-06-22 MED ORDER — CIPROFLOXACIN-DEXAMETHASONE 0.3-0.1 % OT SUSP: 4.0000 [drp] | Freq: Two times a day (BID) | OTIC | 0 refills | Status: DC

## 2016-06-22 NOTE — ED Notes (Signed)
Pt reports ear pain that started yesterday. Pt noticed 2 days ago, she felt something in her ear, she put tissue in her ear and when she pulled it out, there was yellow and bloody drainage. Pt also reports small red bumps over her body that itch. Several small bumps noted to bilateral upper extremities.

## 2016-06-22 NOTE — ED Provider Notes (Signed)
[facility] DEPT Provider Note   CSN: [id number] Arrival date & time: 06/22/16  1630   By signing my name below, I, Clarisse Gouge, attest that this documentation has been prepared under the direction and in the presence of [name], FNP. Electronically Signed: Clarisse Gouge, Scribe. 06/22/16. 7:04 PM.   History   Chief Complaint Chief Complaint  Patient presents with  . Otalgia   The history is provided by the patient and medical records. No language interpreter was used.    HPI Comments: [name] Norris is a 46 y.o. female who presents to the Emergency Department complaining of right ear pain x 2-3 days. She notes associated bloody drainage, headache and decreased hearing in the affected ear. Hx of 2 surgeries on the right ear noted, performed at [facility]. Pt denies fever/chills and N/V/D. PCP at [facility].  Pt also notes itching and bumps to bilateral upper extremities  Past Medical History:  Diagnosis Date  . Acid reflux   . Heart murmur   . Reflux     Patient Active Problem List   Diagnosis Date Noted  . Esophageal reflux 05/13/2011  . Chest pain, atypical 05/13/2011  . Nausea 05/13/2011  . Acid reflux     Past Surgical History:  Procedure Laterality Date  . CESAREAN SECTION    . MIDDLE EAR SURGERY     x2    OB History    No data available       Home Medications    Prior to Admission medications   Medication Sig Start Date End Date Taking? Authorizing Provider  acetaminophen-codeine (TYLENOL #3) 300-30 MG per tablet Take 1-2 tablets by mouth every 6 (six) hours as needed for pain. 01/20/13   Quita Skye, MD  albuterol (PROVENTIL HFA;VENTOLIN HFA) 108 (90 Base) MCG/ACT inhaler Inhale 1-2 puffs into the lungs every 6 (six) hours as needed for wheezing or shortness of breath. 09/02/15   Charlynne Pander, MD  ALPRAZolam [name]) 1 MG tablet Take 1 mg by mouth 3 (three) times daily.    Historical Provider, MD  benzonatate (TESSALON) 100 MG  capsule Take 1 capsule (100 mg total) by mouth every 8 (eight) hours. Patient not taking: Reported on 09/02/2015 07/31/15   Eyvonne Mechanic, PA-C  cephALEXin (KEFLEX) 500 MG capsule Take 1 capsule (500 mg total) by mouth 3 (three) times daily. 09/02/15   Charlynne Pander, MD  cetirizine (ZYRTEC ALLERGY) 10 MG tablet Take 1 tablet (10 mg total) by mouth daily. 04/07/16   Georgiana Shore, PA-C  cyclobenzaprine (FLEXERIL) 10 MG tablet Take 1 tablet (10 mg total) by mouth at bedtime. 12/19/15   Christopher Lawyer, PA-C  fluticasone (FLONASE) 50 MCG/ACT nasal spray Place 1 spray into both nostrils daily. 04/07/16   Georgiana Shore, PA-C  HYDROcodone-acetaminophen (NORCO) 5-325 MG per tablet Take 1 tablet by mouth every 6 (six) hours as needed for pain. Patient not taking: Reported on 09/02/2015 01/18/13   Lemont Fillers Kirichenko, PA-C  methocarbamol (ROBAXIN) 500 MG tablet Take 1 tablet (500 mg total) by mouth 2 (two) times daily. 04/07/16   Georgiana Shore, PA-C  naproxen (NAPROSYN) 250 MG tablet Take 1 tablet (250 mg total) by mouth 2 (two) times daily. 04/07/16   Georgiana Shore, PA-C  omeprazole (PRILOSEC) 20 MG capsule Take 1 capsule (20 mg total) by mouth daily. 04/07/16   Georgiana Shore, PA-C  oxyCODONE-acetaminophen (PERCOCET/ROXICET) 5-325 MG tablet Take 2 tablets by mouth every 4 (four) hours as needed for severe  pain. Patient not taking: Reported on 09/02/2015 05/07/15   Catha Gosselin, PA-C  PARoxetine (PAXIL) 20 MG tablet Take 20 mg by mouth every morning.    Historical Provider, MD  predniSONE (DELTASONE) 50 MG tablet Take 1 tablet (50 mg total) by mouth daily. 12/19/15   Charlestine Night, PA-C  traMADol (ULTRAM) 50 MG tablet Take 1 tablet (50 mg total) by mouth every 6 (six) hours as needed for severe pain. 12/19/15   Charlestine Night, PA-C    Family History History reviewed. No pertinent family history.  Social History Social History  Substance Use Topics  . Smoking status:  Never Smoker  . Smokeless tobacco: Never Used  . Alcohol use No     Allergies   Hydrocortisone   Review of Systems Review of Systems  All other systems reviewed and are negative.  A complete 10 system review of systems was obtained and all systems are negative except as noted in the HPI and PMH.    Physical Exam Updated Vital Signs BP 125/76 (BP Location: Right Arm)   Pulse 64   Temp 97.7 F (36.5 C) (Oral)   Resp 18   SpO2 98%   Physical Exam  Constitutional: She is oriented to person, place, and time. Vital signs are normal. She appears well-developed and well-nourished.  Non-toxic appearance. No distress.  Afebrile, nontoxic, NAD  HENT:  Head: Normocephalic and atraumatic.  Right Ear: There is drainage. Tympanic membrane is not erythematous. Decreased hearing is noted.  Mouth/Throat: Mucous membranes are normal.  Right external canal erythematous with blood tinged drainage. Right TM intact and non erythematous.   Eyes: Conjunctivae and EOM are normal. Right eye exhibits no discharge. Left eye exhibits no discharge.  Neck: Normal range of motion. Neck supple.  Cardiovascular: Normal rate and intact distal pulses.   Pulmonary/Chest: Effort normal. No respiratory distress.  Abdominal: Normal appearance. She exhibits no distension.  Musculoskeletal: Normal range of motion.  Neurological: She is alert and oriented to person, place, and time. She has normal strength. No sensory deficit.  Skin: Skin is warm, dry and intact. Lesion noted. No rash noted.  Nonspecific lesions to bilateral upper extremities, prurulent in nature, appear to be bug bites.  Psychiatric: She has a normal mood and affect. Her behavior is normal.  Nursing note and vitals reviewed.    ED Treatments / Results  DIAGNOSTIC STUDIES: Oxygen Saturation is 98% on RA, normal by my interpretation.    COORDINATION OF CARE: 6:52 PM Discussed treatment plan with pt at bedside and pt agreed to plan. Will  order abx and steroids.  Labs (all labs ordered are listed, but only abnormal results are displayed) Labs Reviewed - No data to display  EKG  EKG Interpretation None       Radiology No results found.  Procedures Procedures (including critical care time)  Medications Ordered in ED Medications - No data to display   Initial Impression / Assessment and Plan / ED Course  I have reviewed the triage vital signs and the nursing notes.  Pertinent labs & imaging results that were available during my care of the patient were reviewed by me and considered in my medical decision making (see chart for details).    Pt presenting with otitis externa. Pt afebrile in NAD. Exam not concerning for mastoiditis, cellulitis or malignant OE. Discharge with ciprofloxacin.  Advised follow up with ENT in 2-3 days if no improvement.  Return precautions discussed. Pt appears safe for discharge.   Final Clinical  Impressions(s) / ED Diagnoses   Final diagnoses:  Other infective acute otitis externa of right ear    New Prescriptions Discharge Medication List as of 06/22/2016  7:16 PM    START taking these medications   Details  Ciprofloxacin HCl 0.2 % otic solution Place 0.2 mLs into the right ear 2 (two) times daily., Starting Tue 06/22/2016, Print      I personally performed the services described in this documentation, which was scribed in my presence. The recorded information has been reviewed and is accurate.    Felicie Mornavid Sigmund Morera, NP 06/22/16 [id number]    Margarita Grizzleanielle Ray, MD 06/23/16 [phone]

## 2016-06-22 NOTE — ED Notes (Signed)
Pt dressed and vitals complete.  Awaiting discharge.

## 2016-06-22 NOTE — ED Triage Notes (Signed)
Pt sts right ear pain with some bloody drainage x 2 days; pt sts hx of surgery on ear

## 2016-07-26 ENCOUNTER — Emergency Department (HOSPITAL_COMMUNITY)
Admission: EM | Admit: 2016-07-26 | Discharge: 2016-07-26 | Disposition: A | Discharge status: Home / Self Care | Payer: Medicaid Other | Attending: Emergency Medicine | Admitting: Emergency Medicine

## 2016-07-26 ENCOUNTER — Encounter (HOSPITAL_COMMUNITY): Payer: Self-pay | Admitting: Emergency Medicine

## 2016-07-26 DIAGNOSIS — M5489 Other dorsalgia: Secondary | ICD-10-CM

## 2016-07-26 DIAGNOSIS — Z79899 Other long term (current) drug therapy: Secondary | ICD-10-CM | POA: Diagnosis not present

## 2016-07-26 DIAGNOSIS — M549 Dorsalgia, unspecified: Secondary | ICD-10-CM | POA: Diagnosis present

## 2016-07-26 MED ORDER — CYCLOBENZAPRINE HCL 10 MG PO TABS: 10.0000 mg | ORAL_TABLET | Freq: Two times a day (BID) | ORAL | 0 refills | Status: DC | PRN

## 2016-07-26 NOTE — ED Provider Notes (Signed)
[facility] Provider Note   CSN: [id number] Arrival date & time: 07/26/16  [id number]   By signing my name below, I, [name], attest that this documentation has been prepared under the direction and in the presence of Teressa LowerVrinda Airiana Elman, NP. Electronically Signed: [name], Scribe. 07/26/16. 9:32 AM. History   Chief Complaint Chief Complaint  Patient presents with  . Back Pain     The history is provided by the patient and a relative. No language interpreter was used.  HPI Comments: [name] Norris is a 46 y.o. female who presents to the Emergency Department complaining of back pain s/p lifting a box 2 days ago. Per son: The patient states that the pain began in the lower part of her back and and now present in the upper part of her back. She also reports some CP but she states that she is more of a chronic problem. She has taken tylenol with no significant relief. She has had back problems in the past but no definite injury is noted.denies numbness, weakness or incontinence  Past Medical History:  Diagnosis Date  . Acid reflux   . Heart murmur   . Reflux     Patient Active Problem List   Diagnosis Date Noted  . Esophageal reflux 05/13/2011  . Chest pain, atypical 05/13/2011  . Nausea 05/13/2011  . Acid reflux     Past Surgical History:  Procedure Laterality Date  . CESAREAN SECTION    . MIDDLE EAR SURGERY     x2    OB History    No data available       Home Medications    Prior to Admission medications   Medication Sig Start Date End Date Taking? Authorizing Provider  acetaminophen-codeine (TYLENOL #3) 300-30 MG per tablet Take 1-2 tablets by mouth every 6 (six) hours as needed for pain. 01/20/13   Quita SkyeMichael Ghim, MD  albuterol (PROVENTIL HFA;VENTOLIN HFA) 108 (90 Base) MCG/ACT inhaler Inhale 1-2 puffs into the lungs every 6 (six) hours as needed for wheezing or shortness of breath. 09/02/15   Charlynne Panderavid Hsienta Yao, MD  ALPRAZolam [name](XANAX) 1 MG  tablet Take 1 mg by mouth 3 (three) times daily.    Historical Provider, MD  benzonatate (TESSALON) 100 MG capsule Take 1 capsule (100 mg total) by mouth every 8 (eight) hours. Patient not taking: Reported on 09/02/2015 07/31/15   Eyvonne MechanicJeffrey Hedges, PA-C  cephALEXin (KEFLEX) 500 MG capsule Take 1 capsule (500 mg total) by mouth 3 (three) times daily. 09/02/15   Charlynne Panderavid Hsienta Yao, MD  cetirizine (ZYRTEC ALLERGY) 10 MG tablet Take 1 tablet (10 mg total) by mouth daily. 04/07/16   Georgiana ShoreJessica B Mitchell, PA-C  Ciprofloxacin HCl 0.2 % otic solution Place 0.2 mLs into the right ear 2 (two) times daily. 06/22/16   Felicie Mornavid Smith, NP  cyclobenzaprine (FLEXERIL) 10 MG tablet Take 1 tablet (10 mg total) by mouth at bedtime. 12/19/15   Christopher Lawyer, PA-C  fluticasone (FLONASE) 50 MCG/ACT nasal spray Place 1 spray into both nostrils daily. 04/07/16   Georgiana ShoreJessica B Mitchell, PA-C  HYDROcodone-acetaminophen (NORCO) 5-325 MG per tablet Take 1 tablet by mouth every 6 (six) hours as needed for pain. Patient not taking: Reported on 09/02/2015 01/18/13   Lemont Fillersatyana Kirichenko, PA-C  methocarbamol (ROBAXIN) 500 MG tablet Take 1 tablet (500 mg total) by mouth 2 (two) times daily. 04/07/16   Georgiana ShoreJessica B Mitchell, PA-C  naproxen (NAPROSYN) 375 MG tablet Take 1 tablet (375 mg total) by mouth 2 (two) times  daily. 06/22/16   [name], NP  omeprazole (PRILOSEC) 20 MG capsule Take 1 capsule (20 mg total) by mouth daily. 04/07/16   Georgiana Shore, PA-C  oxyCODONE-acetaminophen (PERCOCET/ROXICET) 5-325 MG tablet Take 2 tablets by mouth every 4 (four) hours as needed for severe pain. Patient not taking: Reported on 09/02/2015 05/07/15   Catha Gosselin, PA-C  PARoxetine (PAXIL) 20 MG tablet Take 20 mg by mouth every morning.    Historical Provider, MD  predniSONE (DELTASONE) 50 MG tablet Take 1 tablet (50 mg total) by mouth daily. 12/19/15   Charlestine Night, PA-C  traMADol (ULTRAM) 50 MG tablet Take 1 tablet (50 mg total) by mouth every 6  (six) hours as needed for severe pain. 12/19/15   Charlestine Night, PA-C    Family History History reviewed. No pertinent family history.  Social History Social History  Substance Use Topics  . Smoking status: Never Smoker  . Smokeless tobacco: Never Used  . Alcohol use No     Allergies   Hydrocortisone   Review of Systems Review of Systems  Cardiovascular: Positive for chest pain.  Musculoskeletal: Positive for back pain.  Neurological: Negative for dizziness and headaches.  All other systems reviewed and are negative.    Physical Exam Updated Vital Signs BP 127/84 (BP Location: Left Arm)   Pulse 83   Temp 97.9 F (36.6 C) (Oral)   Resp 16   SpO2 100%   Physical Exam  Constitutional: She is oriented to person, place, and time. She appears well-developed and well-nourished.  HENT:  Head: Normocephalic.  Eyes: EOM are normal.  Neck: Normal range of motion.  Pulmonary/Chest: Effort normal.  Abdominal: She exhibits no distension.  Musculoskeletal: Normal range of motion.  Lumbar spine and paraspinal tenderness. Able to do bilateral straight leg raises. Pulses intact. Good sensation and strength to the area  Neurological: She is alert and oriented to person, place, and time.  Psychiatric: She has a normal mood and affect.  Nursing note and vitals reviewed.    ED Treatments / Results  DIAGNOSTIC STUDIES: Oxygen Saturation is 100% on RA, normal by my interpretation.    COORDINATION OF CARE: 9:24 AM Discussed treatment plan with pt at bedside and pt agreed to plan. I will check the patient's X-ray.  Labs (all labs ordered are listed, but only abnormal results are displayed) Labs Reviewed - No data to display  EKG  EKG Interpretation None       Radiology No results found.  Procedures Procedures (including critical care time)  Medications Ordered in ED Medications - No data to display   Initial Impression / Assessment and Plan / ED Course  I  have reviewed the triage vital signs and the nursing notes.  Pertinent labs & imaging results that were available during my care of the patient were reviewed by me and considered in my medical decision making (see chart for details).     No red flag symptoms. Will treat symptomatically with flexiril. Don't think any imaging is needed at this time  Final Clinical Impressions(s) / ED Diagnoses   Final diagnoses:  None    New Prescriptions New Prescriptions   No medications on file   I personally performed the services described in this documentation, which was scribed in my presence. The recorded information has been reviewed and is accurate.    Teressa Lower, NP 07/26/16 1026    Raeford Razor, MD 07/26/16 1130

## 2016-07-26 NOTE — ED Triage Notes (Signed)
Pt sts lower back pain after lifting a box on Saturday

## 2016-08-06 ENCOUNTER — Emergency Department: Payer: Medicaid Other

## 2016-08-06 ENCOUNTER — Emergency Department
Admission: EM | Admit: 2016-08-06 | Discharge: 2016-08-06 | Disposition: A | Discharge status: Home / Self Care | Payer: Medicaid Other | Attending: Emergency Medicine | Admitting: Emergency Medicine

## 2016-08-06 ENCOUNTER — Encounter: Payer: Self-pay | Admitting: Emergency Medicine

## 2016-08-06 DIAGNOSIS — X501XXA Overexertion from prolonged static or awkward postures, initial encounter: Secondary | ICD-10-CM | POA: Diagnosis not present

## 2016-08-06 DIAGNOSIS — Y9389 Activity, other specified: Secondary | ICD-10-CM | POA: Insufficient documentation

## 2016-08-06 DIAGNOSIS — M5431 Sciatica, right side: Secondary | ICD-10-CM | POA: Diagnosis not present

## 2016-08-06 DIAGNOSIS — Y9289 Other specified places as the place of occurrence of the external cause: Secondary | ICD-10-CM | POA: Diagnosis not present

## 2016-08-06 DIAGNOSIS — Y999 Unspecified external cause status: Secondary | ICD-10-CM | POA: Insufficient documentation

## 2016-08-06 DIAGNOSIS — M545 Low back pain: Secondary | ICD-10-CM | POA: Diagnosis present

## 2016-08-06 LAB — POCT PREGNANCY, URINE: Preg Test, Ur: NEGATIVE

## 2016-08-06 MED ORDER — KETOROLAC TROMETHAMINE 30 MG/ML IJ SOLN
30.0000 mg | Freq: Once | INTRAMUSCULAR | Status: AC
  Administered 2016-08-06: 30 mg via INTRAMUSCULAR
  Filled 2016-08-06: qty 1

## 2016-08-06 MED ORDER — MELOXICAM 15 MG PO TABS: 15.0000 mg | ORAL_TABLET | Freq: Every day | ORAL | 0 refills | Status: DC
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
## 2017-01-30 ENCOUNTER — Emergency Department (HOSPITAL_COMMUNITY)
Admission: EM | Admit: 2017-01-30 | Discharge: 2017-01-31 | Disposition: A | Discharge status: Home / Self Care | Payer: PRIVATE HEALTH INSURANCE | Attending: Emergency Medicine | Admitting: Emergency Medicine

## 2017-01-30 ENCOUNTER — Encounter (HOSPITAL_COMMUNITY): Payer: Self-pay | Admitting: Emergency Medicine

## 2017-01-30 DIAGNOSIS — H5711 Ocular pain, right eye: Secondary | ICD-10-CM | POA: Insufficient documentation

## 2017-01-30 DIAGNOSIS — Z79899 Other long term (current) drug therapy: Secondary | ICD-10-CM | POA: Insufficient documentation

## 2017-01-30 MED ORDER — TETRACAINE HCL 0.5 % OP SOLN
2.0000 [drp] | Freq: Once | OPHTHALMIC | Status: AC
  Administered 2017-01-30: 2 [drp] via OPHTHALMIC
  Filled 2017-01-30: qty 4

## 2017-01-30 MED ORDER — FLUORESCEIN SODIUM 0.6 MG OP STRP
1.0000 | ORAL_STRIP | Freq: Once | OPHTHALMIC | Status: AC
Start: 2017-01-30 — End: 2017-01-30
  Administered 2017-01-30: 1 via OPHTHALMIC
  Filled 2017-01-30: qty 1

## 2017-01-30 NOTE — ED Provider Notes (Signed)
[facility]-EMERGENCY DEPT Provider Note   CSN: [id number] Arrival date & time: 01/30/17  2106     History   Chief Complaint Chief Complaint  Patient presents with  . Eye Pain    HPI [name] Norris is a 46 y.o. female.  HPI   46 year old female presenting complaining of right eye discomfort. 3 weeks ago while at work patient was opening a box when some white debris flew into her right eye. She reported immediate pain to the affected eye follows with redness. She has been seen by an eye specialist in 2 separate occasions since. States that she had a full eye exam and they were able to remove a small plastic like foreign object out from her right eye. She was prescribed polytrim eye drops.  She did use the medication as prescribed, didn't notice much improvement and return again a week later.  She was prescribed the same medication.  Pt still report having pain to the corner of her R eye, increase tear production, tears makes her vision blur. Her R eye would matted shut in the morning.  She still having pain and redness to the same eye.  She is here requesting for a different treatment. No hx of diabetes.  Does not wear contact lens.    Past Medical History:  Diagnosis Date  . Acid reflux   . Heart murmur   . Reflux     Patient Active Problem List   Diagnosis Date Noted  . Esophageal reflux 05/13/2011  . Chest pain, atypical 05/13/2011  . Nausea 05/13/2011  . Acid reflux     Past Surgical History:  Procedure Laterality Date  . CESAREAN SECTION    . MIDDLE EAR SURGERY     x2    OB History    No data available       Home Medications    Prior to Admission medications   Medication Sig Start Date End Date Taking? Authorizing Provider  albuterol (PROVENTIL HFA;VENTOLIN HFA) 108 (90 Base) MCG/ACT inhaler Inhale 1-2 puffs into the lungs every 6 (six) hours as needed for wheezing or shortness of breath. 09/02/15   [name], [name], MD  ALPRAZolam [name](XANAX) 1 MG tablet Take  1 mg by mouth 3 (three) times daily.    [provider]  cephALEXin (KEFLEX) 500 MG capsule Take 1 capsule (500 mg total) by mouth 3 (three) times daily. 09/02/15   [name], [name], MD  cetirizine (ZYRTEC ALLERGY) 10 MG tablet Take 1 tablet (10 mg total) by mouth daily. 04/07/16   [name], [name], PA-C  cyclobenzaprine (FLEXERIL) 10 MG tablet Take 1 tablet (10 mg total) by mouth 2 (two) times daily as needed for muscle spasms. 07/26/16   [name], [name], NP  fluticasone (FLONASE) 50 MCG/ACT nasal spray Place 1 spray into both nostrils daily. 04/07/16   [name], [name], PA-C  meloxicam (MOBIC) 15 MG tablet Take 1 tablet (15 mg total) by mouth daily. 08/06/16   Cuthriell, Delorise RoyalsJonathan D, PA-C  naproxen (NAPROSYN) 375 MG tablet Take 1 tablet (375 mg total) by mouth 2 (two) times daily. 06/22/16   Felicie MornSmith, David, NP  omeprazole (PRILOSEC) 20 MG capsule Take 1 capsule (20 mg total) by mouth daily. 04/07/16   [name], [name], PA-C  PARoxetine (PAXIL) 20 MG tablet Take 20 mg by mouth every morning.    [provider]    Family History No family history on file.  Social History Social History  Substance Use Topics  . Smoking status:  Never Smoker  . Smokeless tobacco: Never Used  . Alcohol use No     Allergies   Hydrocortisone   Review of Systems Review of Systems   Physical Exam Updated Vital Signs BP 134/72 (BP Location: Right Arm)   Pulse 82   Temp 98.1 F (36.7 C) (Oral)   Resp 16   SpO2 97%   Physical Exam  Constitutional: She appears well-developed and well-nourished. No distress.  HENT:  Head: Atraumatic.  Eyes: Pupils are equal, round, and reactive to light. EOM and lids are normal. Lids are everted and swept, no foreign bodies found. Right eye exhibits chemosis. Right eye exhibits no discharge, no exudate and no hordeolum. No foreign body present in the right eye. Left eye exhibits no chemosis, no discharge, no exudate and no hordeolum. No foreign  body present in the left eye. Right conjunctiva is injected. Right conjunctiva has no hemorrhage. Left conjunctiva is not injected. Left conjunctiva has no hemorrhage. No scleral icterus.  Slit lamp exam:      The right eye shows no corneal abrasion, no corneal flare, no corneal ulcer, no foreign body, no hyphema, no hypopyon, no fluorescein uptake and no anterior chamber bulge.  Visual Acuity Bilateral Distance: 20/63 R Distance: 20/125 L Distance: 20/25    Neck: Neck supple.  Neurological: She is alert.  Skin: No rash noted.  Psychiatric: She has a normal mood and affect.  Nursing note and vitals reviewed.    ED Treatments / Results  Labs (all labs ordered are listed, but only abnormal results are displayed) Labs Reviewed - No data to display  EKG  EKG Interpretation None       Radiology No results found.  Procedures Procedures (including critical care time)  Medications Ordered in ED Medications  fluorescein ophthalmic strip 1 strip (not administered)  tetracaine (PONTOCAINE) 0.5 % ophthalmic solution 2 drop (not administered)     Initial Impression / Assessment and Plan / ED Course  I have reviewed the triage vital signs and the nursing notes.  Pertinent labs & imaging results that were available during my care of the patient were reviewed by me and considered in my medical decision making (see chart for details).     BP 134/72 (BP Location: Right Arm)   Pulse 82   Temp 98.1 F (36.7 C) (Oral)   Resp 16   SpO2 97%    Final Clinical Impressions(s) / ED Diagnoses   Final diagnoses:  Eye pain, right    New Prescriptions New Prescriptions   ERYTHROMYCIN OPHTHALMIC OINTMENT    Place a 1/2 inch ribbon of ointment into the lower eyelid twice daily x 1 week   12:00 AM Pt report continued pain to R eye after it was hit with a small debris 3 weeks ago when pt was opening a box at work.  She reportedly have been evaluated by eye specialists twice since.   Report a fb was removed during one of the visit and pt have been given polytrim with minimal improvement.  Today her conjunctiva is injected, decrease vision to R eye as compare to left.  No obvious fb noted on my exam, no corneal abrasion or corneal ulcer.  Normal IOP of 18 to R eye.  Plan to switch abx to erythromycin ointment but recommend pt to f/u with ophthalmology tomorrow for further care.    Fayrene Helper, PA-C 01/31/17 Julienne Kass    Jacalyn Lefevre, MD 02/01/17 [phone]

## 2017-01-30 NOTE — ED Triage Notes (Addendum)
Pt reports that her right eye has "something in it."  She has been treated but "when the medicine is gone the pain comes back."  Reports that in the morning her right eye looks "foggy".  Eye does appear red and watering

## 2017-01-31 MED ORDER — ERYTHROMYCIN 5 MG/GM OP OINT: TOPICAL_OINTMENT | OPHTHALMIC | 0 refills | Status: DC

## 2017-01-31 NOTE — Discharge Instructions (Signed)
You have been evaluated for your right eye pain.  Apply erythromycin ointment to right eye twice daily for 1 week.  Follow up with eye specialist next week for further care.

## 2017-07-07 ENCOUNTER — Other Ambulatory Visit: Payer: Self-pay

## 2017-07-07 ENCOUNTER — Emergency Department (HOSPITAL_COMMUNITY)
Admission: EM | Admit: 2017-07-07 | Discharge: 2017-07-07 | Disposition: A | Discharge status: Home / Self Care | Payer: No Typology Code available for payment source | Attending: Emergency Medicine | Admitting: Emergency Medicine

## 2017-07-07 ENCOUNTER — Emergency Department (HOSPITAL_COMMUNITY): Payer: No Typology Code available for payment source

## 2017-07-07 DIAGNOSIS — M25562 Pain in left knee: Secondary | ICD-10-CM | POA: Insufficient documentation

## 2017-07-07 DIAGNOSIS — R079 Chest pain, unspecified: Secondary | ICD-10-CM | POA: Diagnosis not present

## 2017-07-07 DIAGNOSIS — Y92410 Unspecified street and highway as the place of occurrence of the external cause: Secondary | ICD-10-CM | POA: Insufficient documentation

## 2017-07-07 DIAGNOSIS — M545 Low back pain: Secondary | ICD-10-CM | POA: Diagnosis not present

## 2017-07-07 DIAGNOSIS — Y9389 Activity, other specified: Secondary | ICD-10-CM | POA: Diagnosis not present

## 2017-07-07 DIAGNOSIS — Y999 Unspecified external cause status: Secondary | ICD-10-CM | POA: Diagnosis not present

## 2017-07-07 MED ORDER — METHOCARBAMOL 500 MG PO TABS: 500.0000 mg | ORAL_TABLET | Freq: Two times a day (BID) | ORAL | 0 refills | Status: DC

## 2017-07-07 MED ORDER — IBUPROFEN 600 MG PO TABS: 600.0000 mg | ORAL_TABLET | Freq: Four times a day (QID) | ORAL | 0 refills | Status: DC | PRN

## 2017-07-07 MED ORDER — KETOROLAC TROMETHAMINE 60 MG/2ML IM SOLN
60.0000 mg | Freq: Once | INTRAMUSCULAR | Status: AC
  Administered 2017-07-07: 60 mg via INTRAMUSCULAR
  Filled 2017-07-07: qty 2

## 2017-07-07 MED ORDER — LIDOCAINE 5 % EX PTCH: 1.0000 | MEDICATED_PATCH | CUTANEOUS | 0 refills | Status: DC

## 2017-07-07 NOTE — ED Notes (Signed)
Patient transported to X-ray 

## 2017-07-07 NOTE — ED Notes (Signed)
Pain meds requested of PA.
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
## 2017-07-07 NOTE — Discharge Instructions (Addendum)
Expect your soreness to increase over the next 2-3 days. Take it easy, but do not lay around too much as this may make any stiffness worse.  There were no acute abnormalities on the x-rays.  X-rays only look at bones, however, your pain is suspected to be from inflammation in the soft tissues. Antiinflammatory medications: Take 600 mg of ibuprofen every 6 hours or 440 mg (over the counter dose) to 500 mg (prescription dose) of naproxen every 12 hours for the next 3 days. After this time, these medications may be used as needed for pain. Take these medications with food to avoid upset stomach. Choose only one of these medications, do not take them together.  Tylenol: Should you continue to have additional pain while taking the ibuprofen or naproxen, you may add in tylenol as needed. Your daily total maximum amount of tylenol from all sources should be limited to 4000mg /day for persons without liver problems, or 2000mg /day for those with liver problems. Muscle relaxer: Robaxin is a muscle relaxer and may help loosen stiff muscles. Do not take the Robaxin while driving or performing other dangerous activities.  Lidocaine patches: These are available via either prescription or over-the-counter. The over-the-counter option may be more economical one and are likely just as effective. There are multiple over-the-counter brands, such as Salonpas. Exercises: Be sure to perform the attached exercises starting with three times a week and working up to performing them daily. This is an essential part of preventing long term problems.   Follow up with a primary care provider for any future management of these complaints.

## 2017-07-11 ENCOUNTER — Telehealth: Payer: Self-pay | Admitting: *Deleted
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
## 2017-07-14 ENCOUNTER — Emergency Department (HOSPITAL_COMMUNITY): Payer: Medicaid Other

## 2017-07-14 ENCOUNTER — Encounter (HOSPITAL_COMMUNITY): Payer: Self-pay | Admitting: Emergency Medicine

## 2017-07-14 ENCOUNTER — Emergency Department (HOSPITAL_COMMUNITY)
Admission: EM | Admit: 2017-07-14 | Discharge: 2017-07-15 | Disposition: A | Discharge status: Home / Self Care | Payer: Medicaid Other | Attending: Emergency Medicine | Admitting: Emergency Medicine

## 2017-07-14 DIAGNOSIS — R6889 Other general symptoms and signs: Secondary | ICD-10-CM

## 2017-07-14 DIAGNOSIS — R509 Fever, unspecified: Secondary | ICD-10-CM | POA: Insufficient documentation

## 2017-07-14 DIAGNOSIS — R05 Cough: Secondary | ICD-10-CM | POA: Insufficient documentation

## 2017-07-14 DIAGNOSIS — R197 Diarrhea, unspecified: Secondary | ICD-10-CM | POA: Insufficient documentation

## 2017-07-14 DIAGNOSIS — R112 Nausea with vomiting, unspecified: Secondary | ICD-10-CM | POA: Insufficient documentation

## 2017-07-14 DIAGNOSIS — M791 Myalgia, unspecified site: Secondary | ICD-10-CM | POA: Insufficient documentation

## 2017-07-14 DIAGNOSIS — R0981 Nasal congestion: Secondary | ICD-10-CM | POA: Insufficient documentation

## 2017-07-14 DIAGNOSIS — J029 Acute pharyngitis, unspecified: Secondary | ICD-10-CM | POA: Insufficient documentation

## 2017-07-14 DIAGNOSIS — Z79899 Other long term (current) drug therapy: Secondary | ICD-10-CM | POA: Insufficient documentation

## 2017-07-14 LAB — CBC WITH DIFFERENTIAL/PLATELET
Basophils Absolute: 0 10*3/uL (ref 0.0–0.1)
Basophils Relative: 0 %
EOS ABS: 0.1 10*3/uL (ref 0.0–0.7)
Eosinophils Relative: 1 %
HCT: 39.2 % (ref 36.0–46.0)
HEMOGLOBIN: 12.6 g/dL (ref 12.0–15.0)
Lymphocytes Relative: 51 %
Lymphs Abs: 4.2 10*3/uL — ABNORMAL HIGH (ref 0.7–4.0)
MCH: 26.9 pg (ref 26.0–34.0)
MCHC: 32.1 g/dL (ref 30.0–36.0)
MCV: 83.8 fL (ref 78.0–100.0)
MONOS PCT: 4 %
Monocytes Absolute: 0.3 10*3/uL (ref 0.1–1.0)
NEUTROS PCT: 44 %
Neutro Abs: 3.7 10*3/uL (ref 1.7–7.7)
Platelets: 196 10*3/uL (ref 150–400)
RBC: 4.68 MIL/uL (ref 3.87–5.11)
RDW: 13.6 % (ref 11.5–15.5)
WBC: 8.3 10*3/uL (ref 4.0–10.5)

## 2017-07-14 LAB — RAPID STREP SCREEN (MED CTR MEBANE ONLY): Streptococcus, Group A Screen (Direct): NEGATIVE

## 2017-07-14 MED ORDER — SODIUM CHLORIDE 0.9 % IV BOLUS (SEPSIS)
1000.0000 mL | Freq: Once | INTRAVENOUS | Status: AC
  Administered 2017-07-14: 1000 mL via INTRAVENOUS

## 2017-07-14 NOTE — ED Notes (Signed)
See provider assessment 

## 2017-07-14 NOTE — ED Notes (Signed)
See EDP assessment 

## 2017-07-14 NOTE — ED Triage Notes (Signed)
Pt reports flu like S/S onset 3 days ago. HA, N/V, nasal congestion, dizziness, body aches, fever/chills.

## 2017-07-15 LAB — COMPREHENSIVE METABOLIC PANEL
ALBUMIN: 3.9 g/dL (ref 3.5–5.0)
ALT: 27 U/L (ref 14–54)
ANION GAP: 8 (ref 5–15)
AST: 23 U/L (ref 15–41)
Alkaline Phosphatase: 56 U/L (ref 38–126)
BUN: 16 mg/dL (ref 6–20)
CHLORIDE: 106 mmol/L (ref 101–111)
CO2: 24 mmol/L (ref 22–32)
Calcium: 9.2 mg/dL (ref 8.9–10.3)
Creatinine, Ser: 0.75 mg/dL (ref 0.44–1.00)
GFR calc Af Amer: >60 mL/min (ref >60)
GFR calc non Af Amer: >60 mL/min (ref >60)
GLUCOSE: 104 mg/dL — AB (ref 65–99)
POTASSIUM: 3.9 mmol/L (ref 3.5–5.1)
SODIUM: 138 mmol/L (ref 135–145)
Total Bilirubin: 0.2 mg/dL — ABNORMAL LOW (ref 0.3–1.2)
Total Protein: 6.9 g/dL (ref 6.5–8.1)

## 2017-07-15 LAB — URINALYSIS, ROUTINE W REFLEX MICROSCOPIC
Bilirubin Urine: NEGATIVE
Glucose, UA: NEGATIVE mg/dL
Hgb urine dipstick: NEGATIVE
KETONES UR: NEGATIVE mg/dL
Nitrite: NEGATIVE
Protein, ur: NEGATIVE mg/dL
SPECIFIC GRAVITY, URINE: 1.025 (ref 1.005–1.030)
pH: 7 (ref 5.0–8.0)

## 2017-07-15 MED ORDER — OSELTAMIVIR PHOSPHATE 75 MG PO CAPS: 75.0000 mg | ORAL_CAPSULE | Freq: Two times a day (BID) | ORAL | 0 refills | Status: DC

## 2017-07-15 MED ORDER — CEPHALEXIN 500 MG PO CAPS: 500.0000 mg | ORAL_CAPSULE | Freq: Two times a day (BID) | ORAL | 0 refills | Status: DC

## 2017-07-15 NOTE — Discharge Instructions (Addendum)
All of your blood work is reassuring. Your symptoms seem consistent with a possible flu.  Have given you a medicine that will sometimes reduce the symptoms of the flu however it is no treatment for the flu.  This medication will give you upset stomach and diarrhea.  May take it if you would like.  Drink plenty of fluids stay hydrated.  Continue taking ibuprofen and Tylenol for fevers and pains.  Warm soaks and Epsom salt.  Follow-up in 2-3 days if symptoms not improving return the ED with any worsening symptoms.
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
## 2017-07-16 LAB — URINE CULTURE

## 2017-07-17 LAB — CULTURE, GROUP A STREP (THRC)

## 2017-11-17 ENCOUNTER — Encounter (HOSPITAL_COMMUNITY): Payer: Self-pay | Admitting: Emergency Medicine

## 2017-11-17 ENCOUNTER — Emergency Department (HOSPITAL_COMMUNITY)
Admission: EM | Admit: 2017-11-17 | Discharge: 2017-11-17 | Disposition: A | Discharge status: Home / Self Care | Payer: Medicaid Other | Attending: Emergency Medicine | Admitting: Emergency Medicine

## 2017-11-17 DIAGNOSIS — W57XXXA Bitten or stung by nonvenomous insect and other nonvenomous arthropods, initial encounter: Secondary | ICD-10-CM

## 2017-11-17 DIAGNOSIS — H7291 Unspecified perforation of tympanic membrane, right ear: Secondary | ICD-10-CM | POA: Insufficient documentation

## 2017-11-17 DIAGNOSIS — L03113 Cellulitis of right upper limb: Secondary | ICD-10-CM | POA: Insufficient documentation

## 2017-11-17 DIAGNOSIS — Z79899 Other long term (current) drug therapy: Secondary | ICD-10-CM | POA: Insufficient documentation

## 2017-11-17 DIAGNOSIS — S60561A Insect bite (nonvenomous) of right hand, initial encounter: Secondary | ICD-10-CM

## 2017-11-17 MED ORDER — HYDROCODONE-ACETAMINOPHEN 5-325 MG PO TABS: 1.0000 | ORAL_TABLET | Freq: Four times a day (QID) | ORAL | 0 refills | Status: DC | PRN

## 2017-11-17 MED ORDER — CEPHALEXIN 500 MG PO CAPS: 500.0000 mg | ORAL_CAPSULE | Freq: Four times a day (QID) | ORAL | 0 refills | Status: DC

## 2017-11-17 MED ORDER — IBUPROFEN 400 MG PO TABS
400.0000 mg | ORAL_TABLET | Freq: Once | ORAL | Status: AC
  Administered 2017-11-17: 400 mg via ORAL
  Filled 2017-11-17: qty 1

## 2017-11-17 MED ORDER — CEPHALEXIN 250 MG PO CAPS
500.0000 mg | ORAL_CAPSULE | Freq: Once | ORAL | Status: AC
Start: 2017-11-17 — End: 2017-11-17
  Administered 2017-11-17: 500 mg via ORAL
  Filled 2017-11-17: qty 2

## 2017-11-17 MED ORDER — HYDROCODONE-ACETAMINOPHEN 5-325 MG PO TABS: 1.0000 | ORAL_TABLET | Freq: Once | ORAL | Status: DC

## 2017-11-17 NOTE — ED Triage Notes (Signed)
  Patient states she was stung by a wasp yesterday afternoon on her R wrist.  Patient states the pain has gotten worse and is shooting up her R arm.  R wrist is swollen, red, and tender to the touch.  Patient also states she has R ear pain.  Patient took tylenol earlier this morning for pain.  States pain is 8/10

## 2017-11-17 NOTE — ED Provider Notes (Signed)
[facility] EMERGENCY DEPARTMENT Provider Note   CSN: [id number] Arrival date & time: 11/17/17  0865     History   Chief Complaint Chief Complaint  Patient presents with  . Insect Bite  . Otalgia    HPI [name] Norris is a 47 y.o. female.  The history is provided by the patient.  Otalgia  This is a new problem. The current episode started more than 2 days ago. There is pain in the right ear. The problem occurs constantly. The problem has been gradually worsening. There has been no fever. The pain is moderate. Associated symptoms include ear discharge.  Patient with history of previous right ear surgery presents with right ear pain.  She reports last week she had significant pain has been having foul-smelling drainage from the ear.  No fevers or vomiting.  She is unsure what surgery she had previously for her ear  She also presents with right arm pain after insect sting.  She reports yesterday a wasp stung her on her wrist, since then she been having worsening pain that shoots into her arm. Tried Tylenol with minimal relief  Past Medical History:  Diagnosis Date  . Acid reflux   . Heart murmur   . Reflux     Patient Active Problem List   Diagnosis Date Noted  . Esophageal reflux 05/13/2011  . Chest pain, atypical 05/13/2011  . Nausea 05/13/2011  . Acid reflux     Past Surgical History:  Procedure Laterality Date  . CESAREAN SECTION    . MIDDLE EAR SURGERY     x2     OB History   None      Home Medications    Prior to Admission medications   Medication Sig Start Date End Date Taking? Authorizing Provider  albuterol (PROVENTIL HFA;VENTOLIN HFA) 108 (90 Base) MCG/ACT inhaler Inhale 1-2 puffs into the lungs every 6 (six) hours as needed for wheezing or shortness of breath. 09/02/15   Charlynne Pander, MD  ALPRAZolam [name]) 1 MG tablet Take 1 mg by mouth 3 (three) times daily.    [provider]  cephALEXin (KEFLEX) 500 MG  capsule Take 1 capsule (500 mg total) by mouth 4 (four) times daily. 11/17/17   Zadie Rhine, MD  cetirizine (ZYRTEC ALLERGY) 10 MG tablet Take 1 tablet (10 mg total) by mouth daily. 04/07/16   Mathews Robinsons B, PA-C  fluticasone (FLONASE) 50 MCG/ACT nasal spray Place 1 spray into both nostrils daily. 04/07/16   Georgiana Shore, PA-C  HYDROcodone-acetaminophen (NORCO/VICODIN) 5-325 MG tablet Take 1 tablet by mouth every 6 (six) hours as needed for severe pain. 11/17/17   Zadie Rhine, MD  lidocaine (LIDODERM) 5 % Place 1 patch onto the skin daily. Remove & Discard patch within 12 hours or as directed by MD 07/07/17   Harolyn Rutherford C, PA-C  omeprazole (PRILOSEC) 20 MG capsule Take 1 capsule (20 mg total) by mouth daily. 04/07/16   Georgiana Shore, PA-C  PARoxetine (PAXIL) 20 MG tablet Take 20 mg by mouth every morning.    [provider]    Family History History reviewed. No pertinent family history.  Social History Social History   Tobacco Use  . Smoking status: Never Smoker  . Smokeless tobacco: Never Used  Substance Use Topics  . Alcohol use: No  . Drug use: No     Allergies   Cortisone and Hydrocortisone   Review of Systems Review of Systems  HENT: Positive for ear  discharge and ear pain.   Respiratory: Negative for shortness of breath.   Musculoskeletal: Positive for myalgias.  Skin: Positive for wound.  All other systems reviewed and are negative.    Physical Exam Updated Vital Signs BP (!) 150/75   Pulse 74   Temp 98 F (36.7 C)   Resp 18   SpO2 99%   Physical Exam CONSTITUTIONAL: Well developed/well nourished HEAD: Normocephalic/atraumatic EYES: EOMI ENMT: Mucous membranes moist, right TM perforation noted discharge noted ear canal, left TM clear/intact No angioedema NECK: supple no meningeal signs SPINE/BACK:entire spine nontender CV: S1/S2 noted, no murmurs/rubs/gallops noted LUNGS: Lungs are clear to auscultation bilaterally, no  apparent distress ABDOMEN: soft NEURO: Pt is awake/alert/appropriate, moves all extremitiesx4.  No facial droop.   EXTREMITIES: pulses normal/equal, full ROM, tenderness to right wrist with localized erythema, no crepitus.  There is evidence of some streaking proximally.  She has full range of motion of right wrist and right elbow SKIN: warm, color normal, no rash except for what is on photo PSYCH: Mildly anxious     ED Treatments / Results  Labs (all labs ordered are listed, but only abnormal results are displayed) Labs Reviewed - No data to display  EKG None  Radiology No results found.  Procedures Procedures   Medications Ordered in ED Medications  ibuprofen (ADVIL,MOTRIN) tablet 400 mg (400 mg Oral Given 11/17/17 0700)  cephALEXin (KEFLEX) capsule 500 mg (500 mg Oral Given 11/17/17 0700)     Initial Impression / Assessment and Plan / ED Course  I have reviewed the triage vital signs and the nursing notes.  Narcotic database reviewed and considered in decision making     For Her right ear, I suspect TM perforation.  Advised no swimming or water in her ear.  She will be started on antibiotics.  I am also concerned that this wasp sting precipitated a cellulitis.  There does appear to be streaking proximally.  No signs of septic joint. Will order Keflex that should cover infection on her arm as well as right ear.  Have referred her to her ENT Dr. Ezzard StandingNewman.   No signs of anaphylaxis or other severe allergic reaction  she can take Benadryl for itching  Final Clinical Impressions(s) / ED Diagnoses   Final diagnoses:  Insect bite of right hand, initial encounter  Cellulitis of right upper extremity  Perforation of right tympanic membrane    ED Discharge Orders        Ordered    cephALEXin (KEFLEX) 500 MG capsule  4 times daily     11/17/17 0704    HYDROcodone-acetaminophen (NORCO/VICODIN) 5-325 MG tablet  Every 6 hours PRN     11/17/17 0704       Zadie RhineWickline,  Barbra Miner, MD 11/17/17 0715
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
## 2018-07-09 ENCOUNTER — Emergency Department (HOSPITAL_COMMUNITY): Payer: Medicaid Other

## 2018-07-09 ENCOUNTER — Emergency Department (HOSPITAL_COMMUNITY)
Admission: EM | Admit: 2018-07-09 | Discharge: 2018-07-09 | Disposition: A | Discharge status: Home / Self Care | Payer: Medicaid Other | Attending: Emergency Medicine | Admitting: Emergency Medicine

## 2018-07-09 DIAGNOSIS — R079 Chest pain, unspecified: Secondary | ICD-10-CM

## 2018-07-09 DIAGNOSIS — Z79899 Other long term (current) drug therapy: Secondary | ICD-10-CM | POA: Insufficient documentation

## 2018-07-09 DIAGNOSIS — R0602 Shortness of breath: Secondary | ICD-10-CM | POA: Insufficient documentation

## 2018-07-09 LAB — LIPASE, BLOOD: LIPASE: 36 U/L (ref 11–51)

## 2018-07-09 LAB — BASIC METABOLIC PANEL
Anion gap: 10 (ref 5–15)
BUN: 16 mg/dL (ref 6–20)
CO2: 24 mmol/L (ref 22–32)
Calcium: 9.9 mg/dL (ref 8.9–10.3)
Chloride: 106 mmol/L (ref 98–111)
Creatinine, Ser: 0.82 mg/dL (ref 0.44–1.00)
GFR calc Af Amer: >60 mL/min (ref >60)
GFR calc non Af Amer: >60 mL/min (ref >60)
Glucose, Bld: 110 mg/dL — ABNORMAL HIGH (ref 70–99)
Potassium: 3.8 mmol/L (ref 3.5–5.1)
Sodium: 140 mmol/L (ref 135–145)

## 2018-07-09 LAB — CBC
HCT: 46.3 % — ABNORMAL HIGH (ref 36.0–46.0)
HEMOGLOBIN: 14.4 g/dL (ref 12.0–15.0)
MCH: 26.6 pg (ref 26.0–34.0)
MCHC: 31.1 g/dL (ref 30.0–36.0)
MCV: 85.6 fL (ref 80.0–100.0)
Platelets: 230 10*3/uL (ref 150–400)
RBC: 5.41 MIL/uL — ABNORMAL HIGH (ref 3.87–5.11)
RDW: 13.2 % (ref 11.5–15.5)
WBC: 6.6 10*3/uL (ref 4.0–10.5)
nRBC: 0 % (ref 0.0–0.2)

## 2018-07-09 LAB — HEPATIC FUNCTION PANEL
ALT: 30 U/L (ref 0–44)
AST: 26 U/L (ref 15–41)
Albumin: 4.3 g/dL (ref 3.5–5.0)
Alkaline Phosphatase: 65 U/L (ref 38–126)
BILIRUBIN INDIRECT: 0.4 mg/dL (ref 0.3–0.9)
Bilirubin, Direct: 0.1 mg/dL (ref 0.0–0.2)
Total Bilirubin: 0.5 mg/dL (ref 0.3–1.2)
Total Protein: 8.4 g/dL — ABNORMAL HIGH (ref 6.5–8.1)

## 2018-07-09 LAB — I-STAT TROPONIN, ED: Troponin i, poc: 0 ng/mL (ref 0.00–0.08)

## 2018-07-09 LAB — I-STAT BETA HCG BLOOD, ED (MC, WL, AP ONLY): I-stat hCG, quantitative: <5 m[IU]/mL (ref <5)

## 2018-07-09 LAB — TROPONIN I: Troponin I: <0.03 ng/mL (ref <0.03)

## 2018-07-09 MED ORDER — AEROCHAMBER PLUS FLO-VU LARGE MISC
1.0000 | Freq: Once | Status: AC
  Administered 2018-07-09: 1

## 2018-07-09 MED ORDER — SODIUM CHLORIDE 0.9% FLUSH: 3.0000 mL | Freq: Once | INTRAVENOUS | Status: DC

## 2018-07-09 MED ORDER — ALBUTEROL SULFATE HFA 108 (90 BASE) MCG/ACT IN AERS
2.0000 | INHALATION_SPRAY | Freq: Once | RESPIRATORY_TRACT | Status: AC
  Administered 2018-07-09: 2 via RESPIRATORY_TRACT
  Filled 2018-07-09: qty 6.7

## 2018-07-09 NOTE — ED Provider Notes (Signed)
Pt presents with chest pain.  EKG shown to me concerning for STEMI however when I review old EKGS pt did have a similar pattern.  Code stemi activated but I will call Dr Eldridge Dace to review.  Pt is having symptoms since Thursday.    Discussed with Dr Eldridge Dace.  Will cancel code stemi for now.  Proceed with  Cardiac workup   Linwood Dibbles, MD 07/09/18 [phone]

## 2018-07-09 NOTE — ED Provider Notes (Signed)
[facility] Note   CSN: [id number] Arrival date & time: 07/09/18  1831     History   Chief Complaint Chief Complaint  Patient presents with  . Chest Pain    HPI [name] Norris is a 48 y.o. female with a past medical history of heart murmur, acid reflux, who presents today for evaluation of chest pain.  She reports that she has been having central chest pain since Thursday with occasional shortness of breath and nausea.  She denies any vomiting.  She has never seen a cardiologist in the [place] before.  She has seen 1 in a foreign country however diet denies any history of heart attack.  She reports that she has been coughing recently.  She denies any history of blood clots.  No recent surgeries or immobilizations.   She does not take any medicines.  Her sons report that at work she works in the cold which makes her cough often.   History obtained through patient using [place] language interpreter as needed.  HPI  Past Medical History:  Diagnosis Date  . Acid reflux   . Heart murmur   . Reflux     Patient Active Problem List   Diagnosis Date Noted  . Esophageal reflux 05/13/2011  . Chest pain, atypical 05/13/2011  . Nausea 05/13/2011  . Acid reflux     Past Surgical History:  Procedure Laterality Date  . CESAREAN SECTION    . MIDDLE EAR SURGERY     x2     OB History   No obstetric history on file.      Home Medications    Prior to Admission medications   Medication Sig Start Date End Date Taking? Authorizing Provider  naproxen sodium (ALEVE) 220 MG tablet Take 440 mg by mouth 2 (two) times daily as needed (pain).   Yes [provider]  albuterol (PROVENTIL HFA;VENTOLIN HFA) 108 (90 Base) MCG/ACT inhaler Inhale 1-2 puffs into the lungs every 6 (six) hours as needed for wheezing or shortness of breath. Patient not taking: Reported on 07/09/2018 09/02/15   [name], [name], MD  cetirizine (ZYRTEC  ALLERGY) 10 MG tablet Take 1 tablet (10 mg total) by mouth daily. Patient not taking: Reported on 07/09/2018 04/07/16   Mathews RobinsonsMitchell, [name], PA-C  fluticasone [facility](FLONASE) 50 MCG/ACT nasal spray Place 1 spray into both nostrils daily. Patient not taking: Reported on 07/09/2018 04/07/16   [name], [name], PA-C  HYDROcodone-acetaminophen (NORCO/VICODIN) 5-325 MG tablet Take 1 tablet by mouth every 6 (six) hours as needed for severe pain. Patient not taking: Reported on 07/09/2018 11/17/17   Zadie RhineWickline, Donald, MD  lidocaine (LIDODERM) 5 % Place 1 patch onto the skin daily. Remove & Discard patch within 12 hours or as directed by MD Patient not taking: Reported on 07/09/2018 07/07/17   [name], Hillard DankerShawn C, PA-C  omeprazole (PRILOSEC) 20 MG capsule Take 1 capsule (20 mg total) by mouth daily. Patient not taking: Reported on 07/09/2018 04/07/16   Gregary CromerMitchell, [name], PA-C    Family History No family history on file.  Social History Social History   Tobacco Use  . Smoking status: Never Smoker  . Smokeless tobacco: Never Used  Substance Use Topics  . Alcohol use: No  . Drug use: No     Allergies   Cortisone and Hydrocortisone   Review of Systems Review of Systems  Constitutional: Negative for chills and fever.  Respiratory: Positive for cough and shortness of breath.  Cardiovascular: Positive for chest pain. Negative for palpitations and leg swelling.  Gastrointestinal: Positive for nausea and vomiting. Negative for abdominal pain and diarrhea.  All other systems reviewed and are negative.    Physical Exam Updated Vital Signs BP 117/79   Pulse 78   Temp 97.9 F (36.6 C) (Oral)   Resp 15   SpO2 100%   Physical Exam Vitals signs and nursing note reviewed.  Constitutional:      General: She is not in acute distress.    Appearance: She is well-developed.  HENT:     Head: Normocephalic and atraumatic.  Eyes:     Conjunctiva/sclera: Conjunctivae normal.  Neck:     Musculoskeletal:  Normal range of motion and neck supple.     Vascular: No JVD.     Trachea: No tracheal deviation.  Cardiovascular:     Rate and Rhythm: Normal rate and regular rhythm.     Pulses:          Radial pulses are 2+ on the right side and 2+ on the left side.       Dorsalis pedis pulses are 2+ on the right side and 2+ on the left side.       Posterior tibial pulses are 2+ on the right side and 2+ on the left side.     Heart sounds: Murmur present.  Pulmonary:     Effort: Pulmonary effort is normal. No respiratory distress.     Breath sounds: Normal breath sounds. No decreased breath sounds, wheezing, rhonchi or rales.  Abdominal:     Palpations: Abdomen is soft.     Tenderness: There is no abdominal tenderness.  Musculoskeletal: Normal range of motion.     Right lower leg: She exhibits no tenderness. No edema.     Left lower leg: She exhibits no tenderness. No edema.  Skin:    General: Skin is warm and dry.  Neurological:     General: No focal deficit present.     Mental Status: She is alert.  Psychiatric:        Mood and Affect: Mood is anxious.        Behavior: Behavior normal.      ED Treatments / Results  Labs (all labs ordered are listed, but only abnormal results are displayed) Labs Reviewed  BASIC METABOLIC PANEL - Abnormal; Notable for the following components:      Result Value   Glucose, Bld 110 (*)    All other components within normal limits  CBC - Abnormal; Notable for the following components:   RBC 5.41 (*)    HCT 46.3 (*)    All other components within normal limits  HEPATIC FUNCTION PANEL - Abnormal; Notable for the following components:   Total Protein 8.4 (*)    All other components within normal limits  LIPASE, BLOOD  TROPONIN I  I-STAT TROPONIN, ED  I-STAT BETA HCG BLOOD, ED ([facility], [facility], AP ONLY)    EKG EKG Interpretation  Date/Time:  Sunday July 09 2018 18:38:26 EST Ventricular Rate:  87 PR Interval:    QRS Duration: 90 QT Interval:  338 QTC  Calculation: 407 R Axis:   86 Text Interpretation:  Sinus rhythm Lateral infarct, acute Baseline wander in lead(s) V6 >>> Acute MI <<< st and t wave changes noted on prior ECG, increased since prior Confirmed by Linwood Dibbles [phone]) on 07/09/2018 6:44:52 PM   EKG Interpretation  Date/Time:  Sunday July 09 2018 22:30:08 EST Ventricular Rate:  77  PR Interval:    QRS Duration: 85 QT Interval:  362 QTC Calculation: 410 R Axis:   52 Text Interpretation:  Sinus rhythm Minimal ST depression, inferior leads t wave changes decreased since prior Confirmed by Linwood Dibbles [phone]) on 07/09/2018 10:34:00 PM        Radiology Dg Chest Port 1 View  Result Date: 07/09/2018 CLINICAL DATA:  48 year old female with chest pain fever and cough for 3 days. EXAM: PORTABLE CHEST 1 VIEW COMPARISON:  07/14/2017 and earlier. FINDINGS: Portable AP semi upright view at 1914 hours. Normal cardiac size and mediastinal contours. Visualized tracheal air column is within normal limits. Lung volumes appear stable. Allowing for portable technique the lungs are clear. No osseous abnormality identified. IMPRESSION: Negative.  No cardiopulmonary abnormality. Electronically Signed   By: Odessa Fleming M.D.   On: 07/09/2018 19:37    Procedures Procedures (including critical care time)  Medications Ordered in ED Medications  sodium chloride flush (NS) 0.9 % injection 3 mL (has no administration in time range)  albuterol (PROVENTIL HFA;VENTOLIN HFA) 108 (90 Base) MCG/ACT inhaler 2 puff (has no administration in time range)  AEROCHAMBER PLUS FLO-VU LARGE MISC 1 each (has no administration in time range)     Initial Impression / Assessment and Plan / ED Course  I have reviewed the triage vital signs and the nursing notes.  Pertinent labs & imaging results that were available during my care of the patient were reviewed by me and considered in my medical decision making (see chart for details).  Clinical Course as of Jul 09 2316    Wynelle Link Jul 09, 2018  2311 Patient reevaluated, she says that her chest is feeling better.  Her sons are in the room who serves as Nurse, learning disability when needed per her request.  Reports that she has been struggling with a lot of anxiety recently and does not have a primary care doctor.  We will give them information for [facility] health and wellness.   [EH]    Clinical Course User Index [EH] Cristina Gong, PA-C   Patient presents today for evaluation of chest pain.  Upon initial EKG there was concern for a STEMI.  Dr. Lynelle Doctor came to see the patient.  Please see his note.  Code STEMI was canceled.  Patient is to be discharged with recommendation to follow up with PCP in regards to today's hospital visit. Chest pain is not likely of cardiac or pulmonary etiology d/t presentation, PERC negative, VSS, no tracheal deviation, no JVD or new murmur, RRR, breath sounds equal bilaterally, EKG without acute abnormalities, negative troponin, and negative CXR. Pt has been advised to return to the ED if CP becomes exertional, associated with diaphoresis or nausea, radiates to left jaw/arm, worsens or becomes concerning in any way. Pt appears reliable for follow up and is agreeable to discharge.   Suspect that patients cough may be related to her cold conditions at work causing reactive airways.  She has used an inhaler in the past, is given one while in the department.    Case has been discussed with and seen by Dr. Lynelle Doctor who agrees with the above plan to discharge.     Final Clinical Impressions(s) / ED Diagnoses   Final diagnoses:  Chest pain, unspecified type  Shortness of breath    ED Discharge Orders    None       Norman Clay 07/09/18 2320    Linwood Dibbles, MD 07/12/18 1329

## 2018-07-09 NOTE — ED Notes (Signed)
Per MD Netty Starring code stemi. Spoke with Phil at [facility].

## 2018-07-09 NOTE — Discharge Instructions (Addendum)
I have given you an inhaler to help with cough and shortness of breath.  You may use it by taking two puffs every 4 hours as needed.

## 2018-07-09 NOTE — ED Notes (Signed)
Activated a code stemi with [facility]

## 2018-07-09 NOTE — ED Notes (Signed)
ACTIVATED STEMI

## 2018-07-09 NOTE — ED Triage Notes (Signed)
Pt speaks [place]. Stating she has been having central CP since Thursday.

## 2018-07-12 ENCOUNTER — Encounter: Payer: Self-pay | Admitting: Cardiology

## 2018-08-02 ENCOUNTER — Encounter: Payer: Self-pay | Admitting: Cardiology

## 2018-08-02 ENCOUNTER — Other Ambulatory Visit: Payer: Self-pay

## 2018-08-02 ENCOUNTER — Ambulatory Visit (INDEPENDENT_AMBULATORY_CARE_PROVIDER_SITE_OTHER): Payer: Self-pay | Admitting: Cardiology

## 2018-08-02 VITALS — BP 140/90 | HR 78 | Ht 66.0 in | Wt 208.1 lb

## 2018-08-02 DIAGNOSIS — R0789 Other chest pain: Secondary | ICD-10-CM

## 2018-08-02 NOTE — Patient Instructions (Signed)
Medication Instructions:  Your physician recommends that you continue on your current medications as directed. Please refer to the Current Medication list given to you today.  If you need a refill on your cardiac medications before your next appointment, please call your pharmacy.   Lab work: None If you have labs (blood work) drawn today and your tests are completely normal, you will receive your results only by: Marland Kitchen MyChart Message (if you have MyChart) OR . A paper copy in the mail If you have any lab test that is abnormal or we need to change your treatment, we will call you to review the results.  Testing/Procedures: Your physician has requested that you have an echocardiogram. Echocardiography is a painless test that uses sound waves to create images of your heart. It provides your doctor with information about the size and shape of your heart and how well your heart's chambers and valves are working. This procedure takes approximately one hour. There are no restrictions for this procedure.  Your physician has requested that you have en exercise stress myoview. For further information please visit [email or web address] Please follow instruction sheet, as given.  Follow-Up: As needed, following your results.

## 2018-08-02 NOTE — Progress Notes (Signed)
Cardiology Office Note    Date:  08/02/2018   ID:  [name] Norris, DOB [birth year], MRN [id number]  PCP:  Patient, No Pcp Per  Cardiologist:  Armanda Magic, MD   Chief Complaint  Patient presents with  . Chest Pain    History of Present Illness:  [name] Norris is a 48 y.o. female who is being seen today for the evaluation of chest pain at the request of Linwood Dibbles, MD   This is a 48 year old female with a history of heart murmur and GERD and presented to the emergency room on 07/09/2018 with chest pain.  She had been having intermittent chest pain for several days that was central in location associated with shortness of breath and nausea.  She has a chronic cough that is worse when he gets cold out.  In ER troponin was negative x2.  Labs were otherwise unremarkable.  EKG showed J-point elevation of 1 mm in leads I and aVL and subtle diffuse ST depression in anterior leads.  Initially this was called a STEMI but later canceled.  She was told to follow-up with cardiology outpatient.  Since her hospitalization she continues to have chest pain.  She describes it as a burning sensation midsternal that occasionally radiates into her right axilla and right shoulder blade.  There is no radiation into her neck jaw or left arm.  She occasionally will feel short of breath with it as well as nauseated.  It can be exertional or nonexertional.  She she says if she is able to get away from her kids and everyone around her and just meditate it will go away.  She does not smoke.  She does have a family history of her father having an MI at 8 years of age.  Denies any PND, orthopnea, lower extremity edema, palpitations, dizziness or syncope.  Past Medical History:  Diagnosis Date  . Acid reflux   . Heart murmur   . Reflux     Past Surgical History:  Procedure Laterality Date  . CESAREAN SECTION    . MIDDLE EAR SURGERY     x2    Current Medications: No outpatient medications have  been marked as taking for the 08/02/18 encounter (Office Visit) with Quintella Reichert, MD.   Current Facility-Administered Medications for the 08/02/18 encounter (Office Visit) with Quintella Reichert, MD  Medication  . 0.9 %  sodium chloride infusion    Allergies:   Cortisone and Hydrocortisone   Social History   Socioeconomic History  . Marital status: Legally Separated    Spouse name: Not on file  . Number of children: 4  . Years of education: Not on file  . Highest education level: Not on file  Occupational History  . Occupation: Veterinary surgeon: [employer]  Social Needs  . Financial resource strain: Not on file  . Food insecurity:    Worry: Not on file    Inability: Not on file  . Transportation needs:    Medical: Not on file    Non-medical: Not on file  Tobacco Use  . Smoking status: Never Smoker  . Smokeless tobacco: Never Used  Substance and Sexual Activity  . Alcohol use: No  . Drug use: No  . Sexual activity: Not on file  Lifestyle  . Physical activity:    Days per week: Not on file    Minutes per session: Not on file  . Stress: Not on file  Relationships  . Social connections:  Talks on phone: Not on file    Gets together: Not on file    Attends religious service: Not on file    Active member of club or organization: Not on file    Attends meetings of clubs or organizations: Not on file    Relationship status: Not on file  Other Topics Concern  . Not on file  Social History Narrative   ** Merged History Encounter **         Family History:  The patient's family history includes CAD in her father; Hypertension in her mother.   ROS:   Please see the history of present illness.    ROS All other systems reviewed and are negative.  No flowsheet data found.     PHYSICAL EXAM:   VS:  BP 140/90   Pulse 78   Ht  (1.676 m)   Wt 208 lb 1.9 oz (94.4 kg)   SpO2 97%   BMI 33.59 kg/m    GEN: Well nourished, well developed, in no acute  distress  HEENT: normal  Neck: no JVD, carotid bruits, or masses Cardiac: RRR; no murmurs, rubs, or gallops,no edema.  Intact distal pulses bilaterally.  Respiratory:  clear to auscultation bilaterally, normal work of breathing GI: soft, nontender, nondistended, + BS MS: no deformity or atrophy  Skin: warm and dry, no rash Neuro:  Alert and Oriented x 3, Strength and sensation are intact Psych: euthymic mood, full affect  Wt Readings from Last 3 Encounters:  08/02/18 208 lb 1.9 oz (94.4 kg)  07/14/17 200 lb (90.7 kg)  08/06/16 177 lb (80.3 kg)      Studies/Labs Reviewed:   EKG:  EKG is  ordered today.  The ekg ordered today demonstrates normal sinus rhythm with T wave inversions in 3 and aVF and subtle ST elevation less than 0.5 mm in 1 and aVL.  Recent Labs: 07/09/2018: ALT 30; BUN 16; Creatinine, Ser 0.82; Hemoglobin 14.4; Platelets 230; Potassium 3.8; Sodium 140   Lipid Panel    Component Value Date/Time   CHOL  01/25/2008 0820    158        ATP III CLASSIFICATION:  <200     mg/dL   Desirable  161-096  mg/dL   Borderline High  >=045    mg/dL   High   TRIG 91 40/98/1191 0820   HDL 43 01/25/2008 0820   CHOLHDL 3.7 01/25/2008 0820   VLDL 18 01/25/2008 0820   LDLCALC  01/25/2008 0820    97        Total Cholesterol/HDL:CHD Risk Coronary Heart Disease Risk Table                     Men   Women  1/2 Average Risk   3.4   3.3    Additional studies/ records that were reviewed today include:  ER records    ASSESSMENT:    1. Chest pain, atypical      PLAN:  In order of problems listed above:  1.  Chest pain -her chest pain is atypical.  She states that is a burning sensation that is midsternal but sometimes radiates into her right axilla and into her right shoulder blade.  There is no radiation into her neck or her left arm.  She does occasionally get short of breath with the burning sensation and occasionally gets nauseated.  It can occur with exertion or  nonexertional.  She says if she sits down and goes  away from people and is able to meditate some the pain will go away.  She denies any PND, orthopnea, dizziness, syncope or lower extremity edema.  Her EKG shows inferior T wave versions in leads III and aVF but otherwise was normal.  She does have cardiac risk factors including her father who had an MI in his 78s.  She does not smoke.  I recommended getting a stress Myoview to rule out ischemia and check 2D echocardiogram to assess for LV function.    Medication Adjustments/Labs and Tests Ordered: Current medicines are reviewed at length with the patient today.  Concerns regarding medicines are outlined above.  Medication changes, Labs and Tests ordered today are listed in the Patient Instructions below.  Patient Instructions  Medication Instructions:  Your physician recommends that you continue on your current medications as directed. Please refer to the Current Medication list given to you today.  If you need a refill on your cardiac medications before your next appointment, please call your pharmacy.   Lab work: None If you have labs (blood work) drawn today and your tests are completely normal, you will receive your results only by: Marland Kitchen MyChart Message (if you have MyChart) OR . A paper copy in the mail If you have any lab test that is abnormal or we need to change your treatment, we will call you to review the results.  Testing/Procedures: Your physician has requested that you have an echocardiogram. Echocardiography is a painless test that uses sound waves to create images of your heart. It provides your doctor with information about the size and shape of your heart and how well your heart's chambers and valves are working. This procedure takes approximately one hour. There are no restrictions for this procedure.  Your physician has requested that you have en exercise stress myoview. For further information please visit  [email or web address] Please follow instruction sheet, as given.  Follow-Up: As needed, following your results.       Signed, Armanda Magic, MD  08/02/2018 3:48 PM    [facility] [address] [place], [place], [place]  [address] Phone: [phone]; Fax: [phone]

## 2018-08-09 ENCOUNTER — Telehealth: Payer: Self-pay | Admitting: *Deleted

## 2018-08-09 NOTE — Telephone Encounter (Signed)
Attempted to call regarding upcoming appointment- no answer, unable to leave a message.  Ricky Ala

## 2018-08-10 ENCOUNTER — Telehealth (HOSPITAL_COMMUNITY): Payer: Self-pay | Admitting: *Deleted

## 2018-08-10 NOTE — Telephone Encounter (Signed)
Attempted to call patient to give instructions for nuclear stress test, no answer, unable to leave a message. [name] Norris

## 2018-08-14 ENCOUNTER — Telehealth: Payer: Self-pay

## 2018-08-14 NOTE — Telephone Encounter (Signed)
-----   Message from Quintella Reichert, MD sent at 08/13/2018  6:43 PM EDT ----- Regarding: RE: stress test cancellation Please call patient and see if she is still having symptoms of chest burning  Traci ----- Message ----- From: Quintella Reichert, MD Sent: 08/11/2018   1:36 PM EDT To: Quintella Reichert, MD Subject: stress test cancellation                         Dr. Mayford Knife   Due to mandatory government regulations rearding COVID-19, we are postponing all elective and non-urgent cardiac imaging procedures.   [name] Norris  is scheduled for a stress test on 08/15/2018. We ask that you follow up with your patient for re-evaluation of symptoms until we are able to reschedule patients for cardiac imaging tests (April 20th, 2020). If you feel patient is too high risk to wait, then proceed with cardiac cath or, alternatively, medical management with close followup with phone call to the patient at your discretion and if symptoms worsen, proceed with cath.   Please forward your decision to both Robin Moffit and Pearline Cables to cancel study and be placed on call back to reschedule study.

## 2018-08-14 NOTE — Telephone Encounter (Signed)
Left a message, with an interpreter company. Will try the patient tomorrow.

## 2018-08-15 ENCOUNTER — Other Ambulatory Visit: Payer: Self-pay

## 2018-08-15 ENCOUNTER — Ambulatory Visit (HOSPITAL_BASED_OUTPATIENT_CLINIC_OR_DEPARTMENT_OTHER): Payer: Medicaid Other

## 2018-08-15 ENCOUNTER — Ambulatory Visit (HOSPITAL_COMMUNITY): Payer: Medicaid Other | Attending: Cardiovascular Disease

## 2018-08-15 DIAGNOSIS — R0789 Other chest pain: Secondary | ICD-10-CM | POA: Insufficient documentation

## 2018-08-15 LAB — MYOCARDIAL PERFUSION IMAGING
Estimated workload: 9 METS
Exercise duration (min): 7 min
Exercise duration (sec): 20 s
LV dias vol: 60 mL (ref 46–106)
LV sys vol: 21 mL
MPHR: 173 {beats}/min
Peak HR: 166 {beats}/min
Percent HR: 95 %
Rest HR: 66 {beats}/min
SDS: 1
SRS: 2
SSS: 3
TID: 0.86

## 2018-08-15 MED ORDER — TECHNETIUM TC 99M TETROFOSMIN IV KIT
10.1000 | PACK | Freq: Once | INTRAVENOUS | Status: AC | PRN
  Administered 2018-08-15: 10.1 via INTRAVENOUS
  Filled 2018-08-15: qty 11

## 2018-08-15 MED ORDER — TECHNETIUM TC 99M TETROFOSMIN IV KIT
30.0000 | PACK | Freq: Once | INTRAVENOUS | Status: AC | PRN
  Administered 2018-08-15: 30 via INTRAVENOUS
  Filled 2018-08-15: qty 30

## 2018-08-15 NOTE — Telephone Encounter (Signed)
Patient arrived to the office for testing.

## 2018-08-16 ENCOUNTER — Telehealth: Payer: Self-pay | Admitting: *Deleted

## 2018-08-16 NOTE — Telephone Encounter (Signed)
-----   Message from Traci R Turner, MD sent at 08/15/2018  1:40 PM EDT ----- Please let patient know that echo was normal 

## 2018-08-16 NOTE — Telephone Encounter (Signed)
-----   Message from Quintella Reichert, MD sent at 08/15/2018  1:40 PM EDT ----- Please let patient know that echo was normal

## 2018-08-16 NOTE — Telephone Encounter (Signed)
Patient notified of result.  Please refer to phone note from today for complete details.   Danielle Rankin, CMA 08/16/2018 2:56 PM   I s/w pt through [employer]. Interpreter [phone] helped with the call. Pt has been notified of her test results and has been advised to f/u PCP for further non cardiac chest pain. Pt has given verbal understanding and thanked me for the call.

## 2018-11-30 IMAGING — DX DG CHEST 2V
2 series · 2 of 2 positions shown · non-contrast
Comparison: 04/07/2016

CLINICAL DATA: Body aches and fever.

EXAM:
CHEST  2 VIEW

[chest lat]
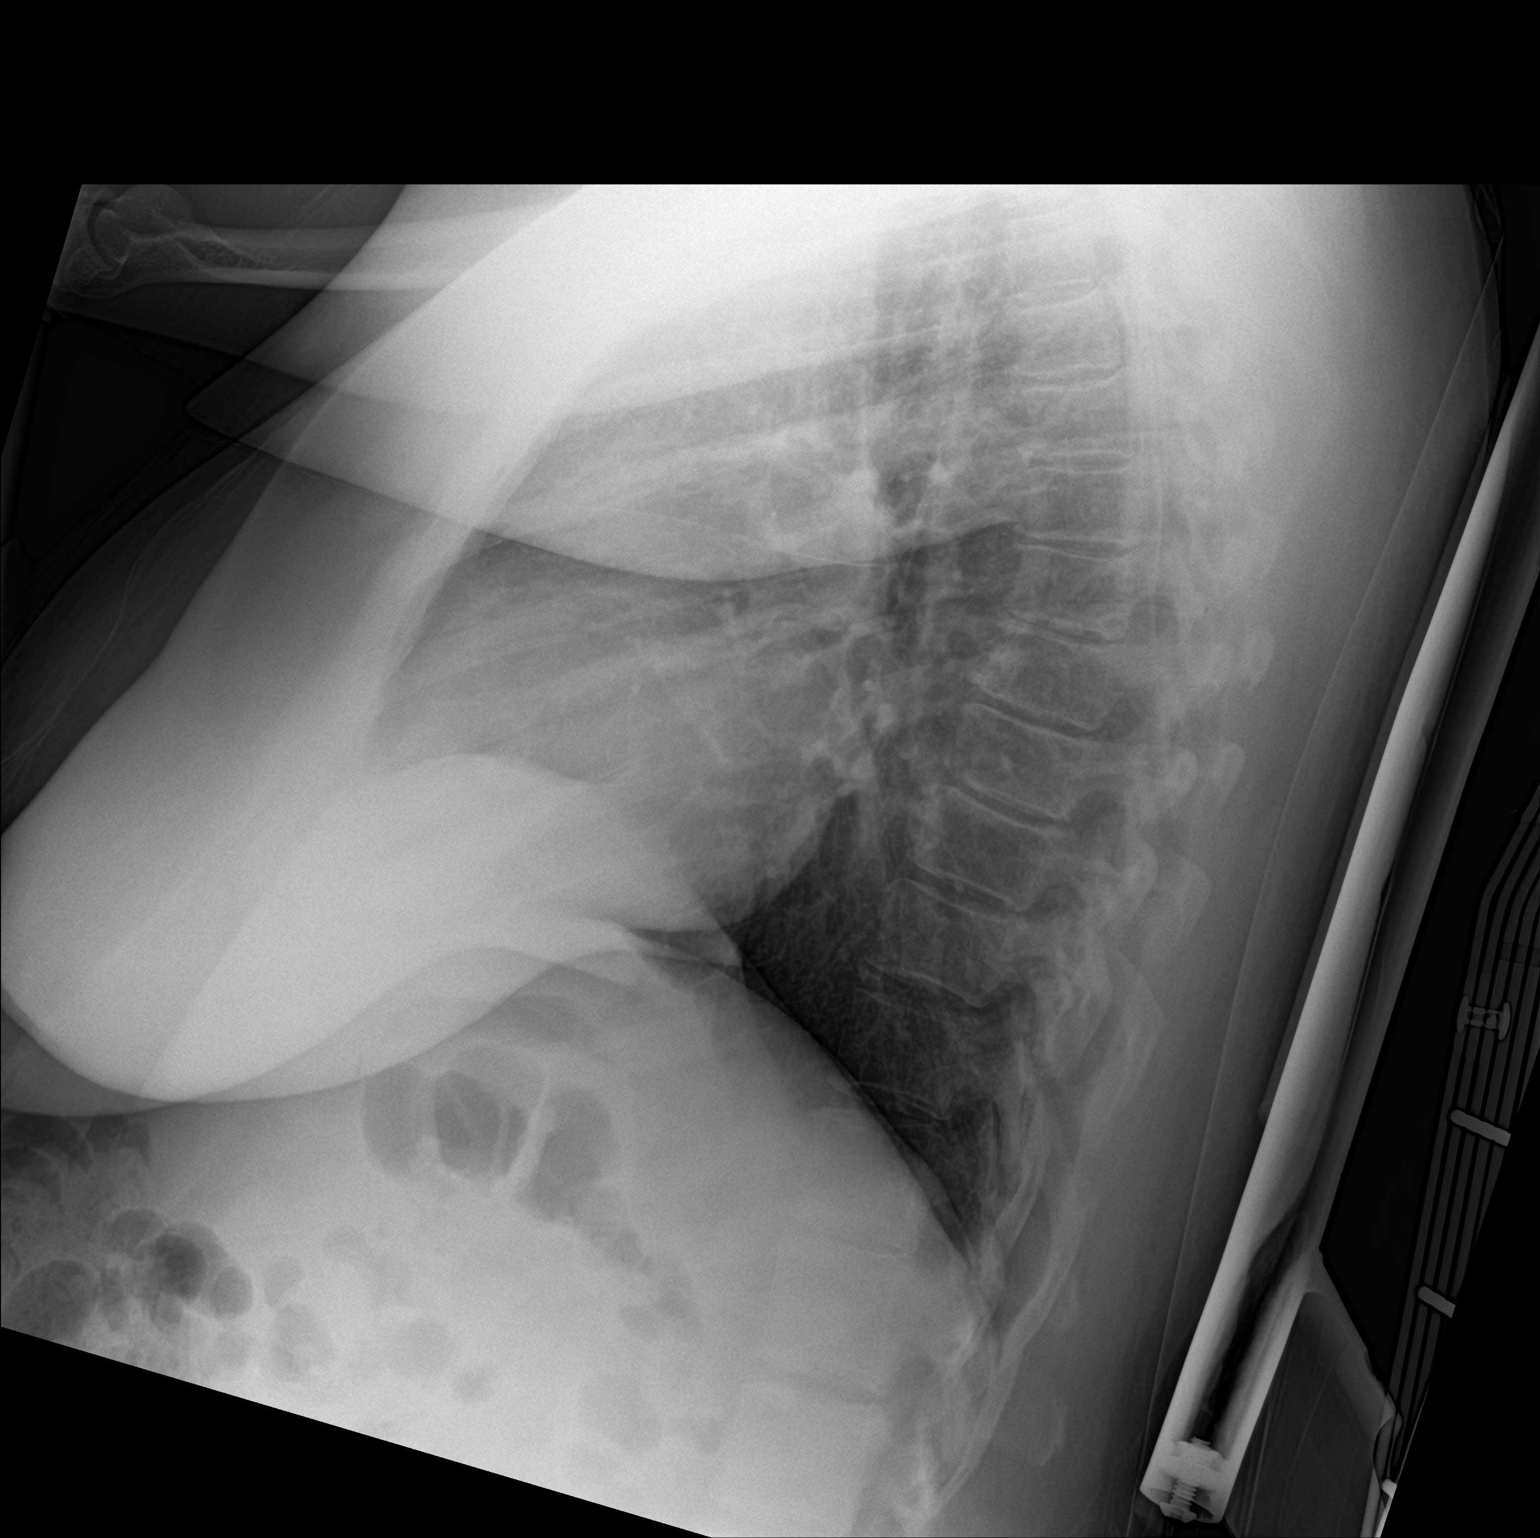

[chest ap]
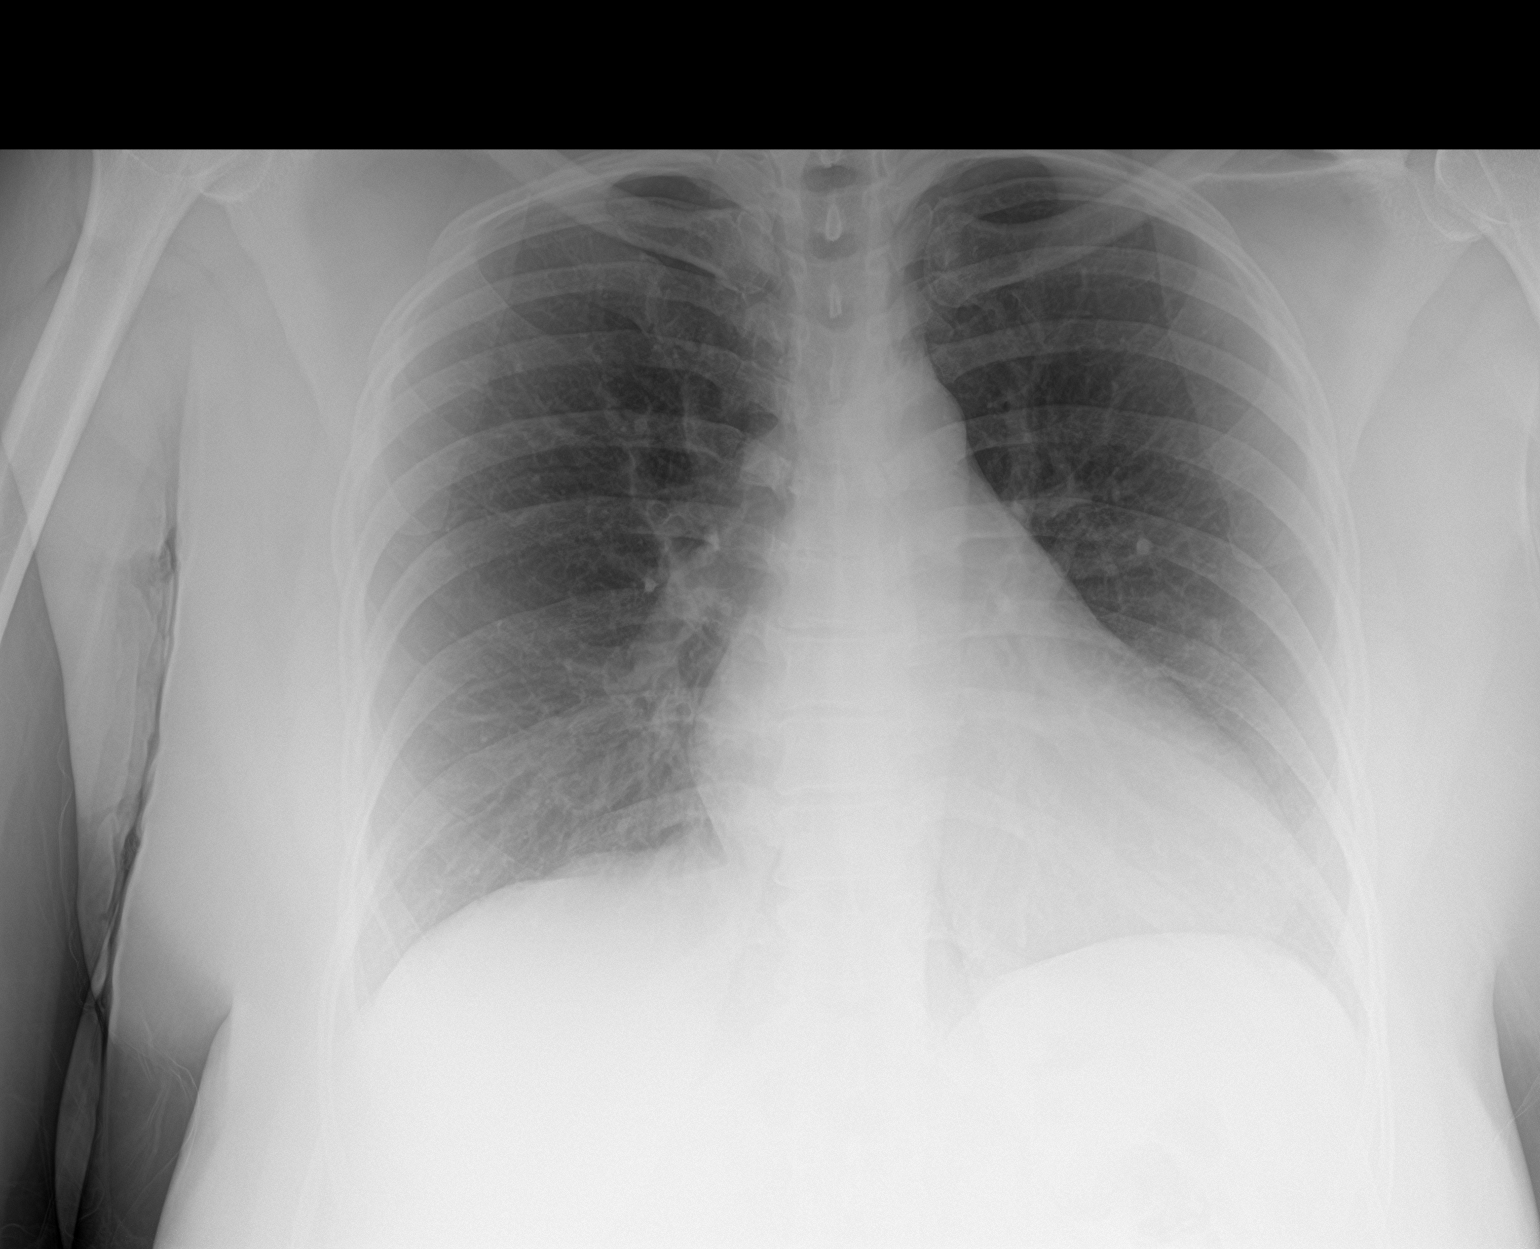

[2 of 2 positions shown; findings below may reference images not displayed]

FINDINGS: Borderline heart size. Negative mediastinal contours. There is no
edema, consolidation, effusion, or pneumothorax. No acute osseous
findings.
IMPRESSION: No evidence of active disease.

## 2019-03-19 ENCOUNTER — Encounter (INDEPENDENT_AMBULATORY_CARE_PROVIDER_SITE_OTHER): Payer: Self-pay
